# Patient Record
Sex: Female | Born: 1995 | Race: Black or African American | Hispanic: No | Marital: Single | Smoking: Never smoker
Health system: Southern US, Community
[De-identification: ages and names within clinical notes are randomized; demographics above are authoritative.]

## PROBLEM LIST (undated history)

## (undated) DIAGNOSIS — F4322 Adjustment disorder with anxiety: Secondary | ICD-10-CM

## (undated) DIAGNOSIS — IMO0001 Reserved for inherently not codable concepts without codable children: Secondary | ICD-10-CM

## (undated) DIAGNOSIS — R030 Elevated blood-pressure reading, without diagnosis of hypertension: Secondary | ICD-10-CM

## (undated) HISTORY — DX: Adjustment disorder with anxiety: F43.22

## (undated) HISTORY — DX: Reserved for inherently not codable concepts without codable children: IMO0001

## (undated) HISTORY — DX: Elevated blood-pressure reading, without diagnosis of hypertension: R03.0

## (undated) HISTORY — PX: MULTIPLE TOOTH EXTRACTIONS: SHX2053

## (undated) HISTORY — PX: WISDOM TOOTH EXTRACTION: SHX21

---
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
## 2017-04-28 NOTE — Progress Notes (Signed)
BP 131/84 (BP Location: Left Arm, Patient Position: Sitting, Cuff Size: Normal)   Pulse 70   Temp 98.5 F (36.9 C)   Wt 192 lb 6 oz (87.3 kg)   SpO2 100%   BMI 31.43 kg/m    Subjective:    Patient ID: Crystal Sullivan, female    DOB: [birth year], 22 y.o.   MRN: [id number]  HPI: Crystal Sullivan is a 22 y.o. female  Chief Complaint  Patient presents with  . screening for STD   VAGINAL DISCHARGE Duration: Past couple of weeks Discharge description: cottage cheese  Pruritus: yes Dysuria: no Malodorous: no Urinary frequency: no Fevers: no Abdominal pain: no  Sexual activity: practicing safe sex History of sexually transmitted diseases: no Recent antibiotic use: no Context: recurrent BV  Treatments attempted: diflucan  STD SCREENING Sexual activity:  Recent unprotected sexual encounter Contraception: yes Recent unprotected intercourse: yes History of sexually transmitted diseases: no Previous sexually transmitted disease screening: no Genital lesions: no Vaginal discharge: yes Dysuria: no Swollen lymph nodes: no Fevers: no Rash: no  Relevant past medical, surgical, family and social history reviewed and updated as indicated. Interim medical history since our last visit reviewed. Allergies and medications reviewed and updated.  Review of Systems  Constitutional: Negative.   Respiratory: Negative.   Cardiovascular: Negative.   Genitourinary: Positive for vaginal discharge. Negative for decreased urine volume, difficulty urinating, dyspareunia, dysuria, enuresis, flank pain, frequency, genital sores, hematuria, menstrual problem, pelvic pain, urgency, vaginal bleeding and vaginal pain.  Hematological: Negative.   Psychiatric/Behavioral: Negative.     Per HPI unless specifically indicated above     Objective:    BP 131/84 (BP Location: Left Arm, Patient Position: Sitting, Cuff Size: Normal)   Pulse 70   Temp 98.5 F (36.9 C)   Wt 192 lb 6 oz (87.3 kg)   SpO2  100%   BMI 31.43 kg/m   Wt Readings from Last 3 Encounters:  04/29/17 192 lb 6 oz (87.3 kg) (97 %, Z= 1.82)*  12/15/16 192 lb (87.1 kg) (97 %, Z= 1.82)*  10/06/16 196 lb 1 oz (88.9 kg) (97 %, Z= 1.89)*   * Growth percentiles are based on CDC (Girls, 2-20 Years) data.    Physical Exam  Constitutional: She is oriented to person, place, and time. She appears well-developed and well-nourished. No distress.  HENT:  Head: Normocephalic and atraumatic.  Right Ear: Hearing normal.  Left Ear: Hearing normal.  Nose: Nose normal.  Eyes: Conjunctivae and lids are normal. Right eye exhibits no discharge. Left eye exhibits no discharge. No scleral icterus.  Pulmonary/Chest: Effort normal. No respiratory distress.  Abdominal: Hernia confirmed negative in the right inguinal area and confirmed negative in the left inguinal area.  Genitourinary: No labial fusion. There is no rash, tenderness, lesion or injury on the right labia. There is no rash, tenderness, lesion or injury on the left labia. No erythema, tenderness or bleeding in the vagina. No foreign body in the vagina. No signs of injury around the vagina. Vaginal discharge found.  Musculoskeletal: Normal range of motion.  Neurological: She is alert and oriented to person, place, and time.  Skin: Skin is warm, dry and intact. No rash noted. She is not diaphoretic. No erythema. No pallor.  Psychiatric: She has a normal mood and affect. Her speech is normal and behavior is normal. Judgment and thought content normal. Cognition and memory are normal.  Nursing note and vitals reviewed.   Results for orders placed or performed in visit  on 12/15/16  GC/Chlamydia Probe Amp  Result Value Ref Range   Chlamydia trachomatis, NAA Negative Negative   Neisseria gonorrhoeae by PCR Negative Negative  RPR  Result Value Ref Range   RPR Ser Ql Non Reactive Non Reactive  HSV(herpes simplex vrs) 1+2 ab-IgG  Result Value Ref Range   HSV 1 Glycoprotein G Ab, IgG  <0.91 0.00 - 0.90 index   HSV 2 IgG, Type Spec <0.91 0.00 - 0.90 index  HIV antibody  Result Value Ref Range   HIV Screen 4th Generation wRfx Non Reactive Non Reactive  WET PREP FOR TRICH, YEAST, CLUE  Result Value Ref Range   Trichomonas Exam Negative Negative   Yeast Exam Negative Negative   Clue Cell Exam Positive (A) Negative      Assessment & Plan:   Problem List Items Addressed This Visit    None    Visit Diagnoses    Routine screening for STI (sexually transmitted infection)    -  Primary   No significant concerns would like to be screened. Labs drawn today. Await results.    Relevant Orders   GC/Chlamydia Probe Amp   HIV antibody   WET PREP FOR TRICH, YEAST, CLUE   HSV(herpes simplex vrs) 1+2 ab-IgG   Hepatitis, Acute   RPR   Vaginal discharge       Negative wet prep. Call with any concerns.    Relevant Orders   WET PREP FOR TRICH, YEAST, CLUE       Follow up plan: Return if symptoms worsen or fail to improve.

## 2017-04-29 ENCOUNTER — Ambulatory Visit (INDEPENDENT_AMBULATORY_CARE_PROVIDER_SITE_OTHER): Payer: BLUE CROSS/BLUE SHIELD | Admitting: Family Medicine

## 2017-04-29 ENCOUNTER — Encounter: Payer: Self-pay | Admitting: Family Medicine

## 2017-04-29 VITALS — BP 131/84 | HR 70 | Temp 98.5°F | Wt 192.4 lb

## 2017-04-29 DIAGNOSIS — Z113 Encounter for screening for infections with a predominantly sexual mode of transmission: Secondary | ICD-10-CM | POA: Diagnosis not present

## 2017-04-29 DIAGNOSIS — N898 Other specified noninflammatory disorders of vagina: Secondary | ICD-10-CM

## 2017-04-29 LAB — WET PREP FOR TRICH, YEAST, CLUE
CLUE CELL EXAM: NEGATIVE
TRICHOMONAS EXAM: NEGATIVE
Yeast Exam: NEGATIVE

## 2017-04-30 ENCOUNTER — Telehealth: Payer: Self-pay | Admitting: Family Medicine

## 2017-04-30 LAB — HEPATITIS PANEL, ACUTE
HEP B C IGM: NEGATIVE
Hep A IgM: NEGATIVE
Hep C Virus Ab: <0.1 s/co ratio (ref 0.0–0.9)
Hepatitis B Surface Ag: NEGATIVE

## 2017-04-30 LAB — HSV(HERPES SIMPLEX VRS) I + II AB-IGG

## 2017-04-30 LAB — RPR: RPR Ser Ql: NONREACTIVE

## 2017-04-30 LAB — HIV ANTIBODY (ROUTINE TESTING W REFLEX): HIV Screen 4th Generation wRfx: NONREACTIVE

## 2017-04-30 NOTE — Telephone Encounter (Signed)
Please let her know that all her labs came back negative, still waiting on the gonorrhea and chalmydia

## 2017-04-30 NOTE — Telephone Encounter (Signed)
Patient notified of lab results

## 2017-05-04 ENCOUNTER — Telehealth: Payer: Self-pay | Admitting: Family Medicine

## 2017-05-04 NOTE — Telephone Encounter (Signed)
Patient notified

## 2017-05-04 NOTE — Telephone Encounter (Signed)
Please let Crystal Sullivan know that her GC/chalmydia was negative

## 2017-05-06 LAB — GC/CHLAMYDIA PROBE AMP
CHLAMYDIA, DNA PROBE: NEGATIVE
Neisseria gonorrhoeae by PCR: NEGATIVE

## 2017-07-07 ENCOUNTER — Encounter: Payer: Self-pay | Admitting: Family Medicine

## 2017-07-07 ENCOUNTER — Ambulatory Visit: Payer: BLUE CROSS/BLUE SHIELD | Admitting: Family Medicine

## 2017-07-07 VITALS — BP 124/86 | HR 87 | Temp 98.4°F | Wt 200.2 lb

## 2017-07-07 DIAGNOSIS — Z113 Encounter for screening for infections with a predominantly sexual mode of transmission: Secondary | ICD-10-CM | POA: Diagnosis not present

## 2017-07-07 NOTE — Progress Notes (Signed)
BP 124/86 (BP Location: Left Arm, Patient Position: Sitting, Cuff Size: Large)   Pulse 87   Temp 98.4 F (36.9 C) (Oral)   Wt 200 lb 3 oz (90.8 kg)   SpO2 100%   BMI 32.71 kg/m    Subjective:    Patient ID: Crystal Sullivan, female    DOB: 1996/03/05, 22 y.o.   MRN: [id number]  HPI: Crystal Sullivan is a 22 y.o. female  Chief Complaint  Patient presents with  . STI screening   STD SCREENING Sexual activity:  Practices careful partner selection, always uses condoms Contraception: no Recent unprotected intercourse: no History of sexually transmitted diseases: no Previous sexually transmitted disease screening: yes Genital lesions: no Vaginal discharge: no Dysuria: no Swollen lymph nodes: no Fevers: no Rash: no  Occasionally get tingling around her mouth, worried about a rash showing up but never has one.   Relevant past medical, surgical, family and social history reviewed and updated as indicated. Interim medical history since our last visit reviewed. Allergies and medications reviewed and updated.  Review of Systems  Constitutional: Negative.   Respiratory: Negative.   Cardiovascular: Negative.   Psychiatric/Behavioral: Negative.     Per HPI unless specifically indicated above     Objective:    BP 124/86 (BP Location: Left Arm, Patient Position: Sitting, Cuff Size: Large)   Pulse 87   Temp 98.4 F (36.9 C) (Oral)   Wt 200 lb 3 oz (90.8 kg)   SpO2 100%   BMI 32.71 kg/m   Wt Readings from Last 3 Encounters:  07/07/17 200 lb 3 oz (90.8 kg)  04/29/17 192 lb 6 oz (87.3 kg) (97 %, Z= 1.82)*  12/15/16 192 lb (87.1 kg) (97 %, Z= 1.82)*   * Growth percentiles are based on CDC (Girls, 2-20 Years) data.    Physical Exam  Constitutional: She is oriented to person, place, and time. She appears well-developed and well-nourished. No distress.  HENT:  Head: Normocephalic and atraumatic.  Right Ear: Hearing normal.  Left Ear: Hearing normal.  Nose: Nose normal.    Eyes: Conjunctivae and lids are normal. Right eye exhibits no discharge. Left eye exhibits no discharge. No scleral icterus.  Cardiovascular: Normal rate, regular rhythm, normal heart sounds and intact distal pulses. Exam reveals no gallop and no friction rub.  No murmur heard. Pulmonary/Chest: Effort normal and breath sounds normal. No stridor. No respiratory distress. She has no wheezes. She has no rales. She exhibits no tenderness.  Musculoskeletal: Normal range of motion.  Neurological: She is alert and oriented to person, place, and time.  Skin: Skin is warm, dry and intact. Capillary refill takes less than 2 seconds. No rash noted. She is not diaphoretic. No erythema. No pallor.  Psychiatric: She has a normal mood and affect. Her speech is normal and behavior is normal. Judgment and thought content normal. Cognition and memory are normal.    Results for orders placed or performed in visit on 04/29/17  GC/Chlamydia Probe Amp  Result Value Ref Range   Chlamydia trachomatis, NAA Negative Negative   Neisseria gonorrhoeae by PCR Negative Negative  WET PREP FOR TRICH, YEAST, CLUE  Result Value Ref Range   Trichomonas Exam Negative Negative   Yeast Exam Negative Negative   Clue Cell Exam Negative Negative  HIV antibody  Result Value Ref Range   HIV Screen 4th Generation wRfx Non Reactive Non Reactive  HSV(herpes simplex vrs) 1+2 ab-IgG  Result Value Ref Range   HSV 1 Glycoprotein G Ab,  IgG <0.91 0.00 - 0.90 index   HSV 2 IgG, Type Spec <0.91 0.00 - 0.90 index  Hepatitis, Acute  Result Value Ref Range   Hep A IgM Negative Negative   Hepatitis B Surface Ag Negative Negative   Hep B C IgM Negative Negative   Hep C Virus Ab <0.1 0.0 - 0.9 s/co ratio  RPR  Result Value Ref Range   RPR Ser Ql Non Reactive Non Reactive      Assessment & Plan:   Problem List Items Addressed This Visit    None    Visit Diagnoses    Screening for STD (sexually transmitted disease)    -  Primary    Labs drawn today. Await results. Call with any concerns.    Relevant Orders   Hepatitis panel, acute   HSV(herpes simplex vrs) 1+2 ab-IgG   HIV antibody   RPR   GC/Chlamydia Probe Amp       Follow up plan: Return in about 3 months (around 10/06/2017) for Physical.

## 2017-07-08 ENCOUNTER — Telehealth: Payer: Self-pay | Admitting: Family Medicine

## 2017-07-08 LAB — RPR: RPR: NONREACTIVE

## 2017-07-08 LAB — HIV ANTIBODY (ROUTINE TESTING W REFLEX): HIV SCREEN 4TH GENERATION: NONREACTIVE

## 2017-07-08 LAB — HEPATITIS PANEL, ACUTE
HEP A IGM: NEGATIVE
Hep B C IgM: NEGATIVE
Hep C Virus Ab: <0.1 s/co ratio (ref 0.0–0.9)
Hepatitis B Surface Ag: NEGATIVE

## 2017-07-08 LAB — HSV(HERPES SIMPLEX VRS) I + II AB-IGG: HSV 2 IgG, Type Spec: <0.91 index (ref 0.00–0.90)

## 2017-07-08 NOTE — Telephone Encounter (Signed)
Please let Crystal Sullivan know that all her labs are normal- just waiting on the GC/Chlamydia

## 2017-07-08 NOTE — Telephone Encounter (Signed)
Patient notified

## 2017-07-09 ENCOUNTER — Telehealth: Payer: Self-pay | Admitting: Family Medicine

## 2017-07-09 LAB — GC/CHLAMYDIA PROBE AMP
CHLAMYDIA, DNA PROBE: NEGATIVE
Neisseria gonorrhoeae by PCR: NEGATIVE

## 2017-07-09 NOTE — Telephone Encounter (Signed)
Patient notified

## 2017-07-09 NOTE — Telephone Encounter (Signed)
Please let her know that her GC/Chlamydia came back negative.

## 2017-07-31 ENCOUNTER — Ambulatory Visit (INDEPENDENT_AMBULATORY_CARE_PROVIDER_SITE_OTHER): Payer: BLUE CROSS/BLUE SHIELD | Admitting: Family Medicine

## 2017-07-31 ENCOUNTER — Encounter: Payer: Self-pay | Admitting: Family Medicine

## 2017-07-31 VITALS — BP 144/83 | HR 68 | Temp 98.5°F | Wt 203.0 lb

## 2017-07-31 DIAGNOSIS — Z113 Encounter for screening for infections with a predominantly sexual mode of transmission: Secondary | ICD-10-CM

## 2017-07-31 DIAGNOSIS — N926 Irregular menstruation, unspecified: Secondary | ICD-10-CM

## 2017-07-31 LAB — UA/M W/RFLX CULTURE, ROUTINE
Bilirubin, UA: NEGATIVE
GLUCOSE, UA: NEGATIVE
Ketones, UA: NEGATIVE
Leukocytes, UA: NEGATIVE
NITRITE UA: NEGATIVE
Protein, UA: NEGATIVE
RBC, UA: NEGATIVE
Specific Gravity, UA: 1.025 (ref 1.005–1.030)
UUROB: 0.2 mg/dL (ref 0.2–1.0)
pH, UA: 6 (ref 5.0–7.5)

## 2017-07-31 LAB — PREGNANCY, URINE: Preg Test, Ur: NEGATIVE

## 2017-07-31 LAB — CHLAMYDIA SCREEN: CHLAMYDIA PROBE AMP: NORMAL

## 2017-07-31 NOTE — Patient Instructions (Signed)
Follow up as needed

## 2017-07-31 NOTE — Progress Notes (Signed)
BP (!) 144/83   Pulse 68   Temp 98.5 F (36.9 C) (Oral)   Wt 203 lb (92.1 kg)   LMP 06/29/2017 (Approximate)   SpO2 100%   BMI 33.17 kg/m    Subjective:    Patient ID: Crystal Sullivan, female    DOB: 10-02-1995, 22 y.o.   MRN: [id number]  HPI: Crystal Sullivan is a 22 y.o. female  Chief Complaint  Patient presents with  . Labs Only    pt would like STD screening   Pt presents today for STI screening. New partner, no new sxs. Denies dysuria, abnormal discharge, vaginal irritation or lesions. Also 2 days late for her period and wanting a pregnancy test done.   Relevant past medical, surgical, family and social history reviewed and updated as indicated. Interim medical history since our last visit reviewed. Allergies and medications reviewed and updated.  Review of Systems  Per HPI unless specifically indicated above     Objective:    BP (!) 144/83   Pulse 68   Temp 98.5 F (36.9 C) (Oral)   Wt 203 lb (92.1 kg)   LMP 06/29/2017 (Approximate)   SpO2 100%   BMI 33.17 kg/m   Wt Readings from Last 3 Encounters:  07/31/17 203 lb (92.1 kg)  07/07/17 200 lb 3 oz (90.8 kg)  04/29/17 192 lb 6 oz (87.3 kg) (97 %, Z= 1.82)*   * Growth percentiles are based on CDC (Girls, 2-20 Years) data.    Physical Exam  Constitutional: She is oriented to person, place, and time. She appears well-developed and well-nourished. No distress.  HENT:  Head: Atraumatic.  Eyes: Pupils are equal, round, and reactive to light. Conjunctivae are normal.  Neck: Normal range of motion. Neck supple.  Cardiovascular: Normal rate.  Pulmonary/Chest: Effort normal. No respiratory distress.  Abdominal: Soft. Bowel sounds are normal. There is no tenderness.  Musculoskeletal: Normal range of motion.  Neurological: She is alert and oriented to person, place, and time.  Skin: Skin is warm and dry. No rash noted.  Psychiatric: She has a normal mood and affect. Her behavior is normal.  Nursing note and  vitals reviewed.   Results for orders placed or performed in visit on 07/07/17  GC/Chlamydia Probe Amp  Result Value Ref Range   Chlamydia trachomatis, NAA Negative Negative   Neisseria gonorrhoeae by PCR Negative Negative  Hepatitis panel, acute  Result Value Ref Range   Hep A IgM Negative Negative   Hepatitis B Surface Ag Negative Negative   Hep B C IgM Negative Negative   Hep C Virus Ab <0.1 0.0 - 0.9 s/co ratio  HSV(herpes simplex vrs) 1+2 ab-IgG  Result Value Ref Range   HSV 1 Glycoprotein G Ab, IgG <0.91 0.00 - 0.90 index   HSV 2 IgG, Type Spec <0.91 0.00 - 0.90 index  HIV antibody  Result Value Ref Range   HIV Screen 4th Generation wRfx Non Reactive Non Reactive  RPR  Result Value Ref Range   RPR Ser Ql Non Reactive Non Reactive      Assessment & Plan:   Problem List Items Addressed This Visit    None    Visit Diagnoses    Missed period    -  Primary   Urine pregnancy negative, recommended safe sexual practices unless desiring conception at this time. Pt states she would be ok either way.   Relevant Orders   Pregnancy, urine   Routine screening for STI (sexually transmitted infection)  Await screening labs. Asymptomatic. Recommended safe sexual practices with use of condoms and regular screening as well as sxs to watch for   Relevant Orders   HIV antibody   RPR   HSV(herpes simplex vrs) 1+2 ab-IgG   GC/Chlamydia Probe Amp   UA/M w/rflx Culture, Routine       Follow up plan: Return if symptoms worsen or fail to improve.

## 2017-08-01 LAB — HSV(HERPES SIMPLEX VRS) I + II AB-IGG

## 2017-08-01 LAB — HIV ANTIBODY (ROUTINE TESTING W REFLEX): HIV SCREEN 4TH GENERATION: NONREACTIVE

## 2017-08-01 LAB — RPR: RPR Ser Ql: NONREACTIVE

## 2017-08-03 ENCOUNTER — Telehealth: Payer: Self-pay | Admitting: Family Medicine

## 2017-08-03 LAB — GC/CHLAMYDIA PROBE AMP
Chlamydia trachomatis, NAA: NEGATIVE
Neisseria gonorrhoeae by PCR: NEGATIVE

## 2017-08-03 NOTE — Telephone Encounter (Signed)
Patient notified

## 2017-08-03 NOTE — Telephone Encounter (Signed)
Please let her know that all her labs came back normal. Thanks! 

## 2017-08-10 ENCOUNTER — Ambulatory Visit (INDEPENDENT_AMBULATORY_CARE_PROVIDER_SITE_OTHER): Payer: BLUE CROSS/BLUE SHIELD | Admitting: Unknown Physician Specialty

## 2017-08-10 ENCOUNTER — Encounter: Payer: Self-pay | Admitting: Unknown Physician Specialty

## 2017-08-10 VITALS — BP 136/88 | HR 78 | Ht 65.6 in | Wt 202.4 lb

## 2017-08-10 DIAGNOSIS — E669 Obesity, unspecified: Secondary | ICD-10-CM | POA: Diagnosis not present

## 2017-08-10 DIAGNOSIS — Z113 Encounter for screening for infections with a predominantly sexual mode of transmission: Secondary | ICD-10-CM

## 2017-08-10 LAB — BAYER DCA HB A1C WAIVED: HB A1C (BAYER DCA - WAIVED): 5.7 % (ref <7.0)

## 2017-08-10 NOTE — Progress Notes (Addendum)
BP 136/88   Pulse 78   Ht 5' 5.6" (1.666 m)   Wt 202 lb 6.4 oz (91.8 kg)   LMP 08/06/2017 (Exact Date)   SpO2 97%   BMI 33.07 kg/m    Subjective:    Patient ID: Crystal Sullivan, female    DOB: 10-Jul-1995, 22 y.o.   MRN: [id number]  HPI: Crystal Sullivan is a 22 y.o. female  Chief Complaint  Patient presents with  . Labs Only    pt states she would like STD labs done   STD screening Pt presents today with the desire for STI screening.  No symptoms but does not trust former partner.  He is out of her life at this time.  Menses started today.    Desires no birth control at this time as caused weight gain.  Does have irregular menses ever since  Family History  Problem Relation Age of Onset  . Hypertension Mother   . Hypertension Father   . Cancer Father        prostate  . Diabetes Paternal Grandmother   . Stroke Paternal Grandfather   . Hypertension Maternal Grandmother      Relevant past medical, surgical, family and social history reviewed and updated as indicated. Interim medical history since our last visit reviewed. Allergies and medications reviewed and updated.  Review of Systems  All other systems reviewed and are negative.   Per HPI unless specifically indicated above     Objective:    BP 136/88   Pulse 78   Ht 5' 5.6" (1.666 m)   Wt 202 lb 6.4 oz (91.8 kg)   LMP 08/06/2017 (Exact Date)   SpO2 97%   BMI 33.07 kg/m   Wt Readings from Last 3 Encounters:  08/10/17 202 lb 6.4 oz (91.8 kg)  07/31/17 203 lb (92.1 kg)  07/07/17 200 lb 3 oz (90.8 kg)    Physical Exam  Constitutional: She is oriented to person, place, and time. She appears well-developed and well-nourished. No distress.  HENT:  Head: Normocephalic and atraumatic.  Eyes: Conjunctivae and lids are normal. Right eye exhibits no discharge. Left eye exhibits no discharge. No scleral icterus.  Cardiovascular: Normal rate.  Pulmonary/Chest: Effort normal.  Abdominal: Normal appearance. There  is no splenomegaly or hepatomegaly.  Musculoskeletal: Normal range of motion.  Neurological: She is alert and oriented to person, place, and time.  Skin: Skin is intact. No rash noted. No pallor.  Psychiatric: She has a normal mood and affect. Her behavior is normal. Judgment and thought content normal.       Assessment & Plan:   Problem List Items Addressed This Visit      Unprioritized   Obesity (BMI 30-39.9)    New diagnosis.  Hgb A1C is 5.7% Qualifies for diet and exercise program which is free of charge at [facility].  Number given.  ? PCOS.  Will explore through primary at physical in July      Relevant Orders   Bayer DCA Hb A1c Waived    Other Visit Diagnoses    Screening examination for STD (sexually transmitted disease)    -  Primary   Counseled pt on pregnancy and STI prevention.  Desires no birth control at this time   Relevant Orders   HIV antibody   HSV(herpes simplex vrs) 1+2 ab-IgG   RPR   GC/Chlamydia Probe Amp([facility])       Follow up plan: Return in July for physical

## 2017-08-10 NOTE — Assessment & Plan Note (Addendum)
New diagnosis.  Hgb A1C is 5.7% Qualifies for diet and exercise program which is free of charge at [facility].  Number given.  ? PCOS.  Will explore through primary at physical in July

## 2017-08-10 NOTE — Addendum Note (Signed)
Addended by: Gabriel Cirri on: 08/10/2017 09:10 AM   Modules accepted: Level of Service

## 2017-08-10 NOTE — Patient Instructions (Addendum)
Polycystic Ovarian Syndrome Polycystic ovarian syndrome (PCOS) is a common hormonal disorder among women of reproductive age. In most women with PCOS, many small fluid-filled sacs (cysts) grow on the ovaries, and the cysts are not part of a normal menstrual cycle. PCOS can cause problems with your menstrual periods and make it difficult to get pregnant. It can also cause an increased risk of miscarriage with pregnancy. If it is not treated, PCOS can lead to serious health problems, such as diabetes and heart disease. What are the causes? The cause of PCOS is not known, but it may be the result of a combination of certain factors, such as:  Irregular menstrual cycle.  High levels of certain hormones (androgens).  Problems with the hormone that helps to control blood sugar (insulin resistance).  Certain genes.  What increases the risk? This condition is more likely to develop in women who have a family history of PCOS. What are the signs or symptoms? Symptoms of PCOS may include:  Multiple ovarian cysts.  Infrequent periods or no periods.  Periods that are too frequent or too heavy.  Unpredictable periods.  Inability to get pregnant (infertility) because of not ovulating.  Increased growth of hair on the face, chest, stomach, back, thumbs, thighs, or toes.  Acne or oily skin. Acne may develop during adulthood, and it may not respond to treatment.  Pelvic pain.  Weight gain or obesity.  Patches of thickened and dark brown or black skin on the neck, arms, breasts, or thighs (acanthosis nigricans).  Excess hair growth on the face, chest, abdomen, or upper thighs (hirsutism).  How is this diagnosed? This condition is diagnosed based on:  Your medical history.  A physical exam, including a pelvic exam. Your health care provider may look for areas of increased hair growth on your skin.  Tests, such as: ? Ultrasound. This may be used to examine the ovaries and the lining of the  uterus (endometrium) for cysts. ? Blood tests. These may be used to check levels of sugar (glucose), female hormone (testosterone), and female hormones (estrogen and progesterone) in your blood.  How is this treated? There is no cure for PCOS, but treatment can help to manage symptoms and prevent more health problems from developing. Treatment varies depending on:  Your symptoms.  Whether you want to have a baby or whether you need birth control (contraception).  Treatment may include nutrition and lifestyle changes along with:  Progesterone hormone to start a menstrual period.  Birth control pills to help you have regular menstrual periods.  Medicines to make you ovulate, if you want to get pregnant.  Medicine to reduce excessive hair growth.  Surgery, in severe cases. This may involve making small holes in one or both of your ovaries. This decreases the amount of testosterone that your body produces.  Follow these instructions at home:  Take over-the-counter and prescription medicines only as told by your health care provider.  Follow a healthy meal plan. This can help you reduce the effects of PCOS. ? Eat a healthy diet that includes lean proteins, complex carbohydrates, fresh fruits and vegetables, low-fat dairy products, and healthy fats. Make sure to eat enough fiber.  If you are overweight, lose weight as told by your health care provider. ? Losing 10% of your body weight may improve symptoms. ? Your health care provider can determine how much weight loss is best for you and can help you lose weight safely.  Keep all follow-up visits as told by   your health care provider. This is important. Contact a health care provider if:  Your symptoms do not get better with medicine.  You develop new symptoms. This information is not intended to replace advice given to you by your health care provider. Make sure you discuss any questions you have with your health care  provider.\ --------------------------------------------------------- [facility] [phone]

## 2017-08-11 LAB — HIV ANTIBODY (ROUTINE TESTING W REFLEX): HIV Screen 4th Generation wRfx: NONREACTIVE

## 2017-08-11 LAB — HSV(HERPES SIMPLEX VRS) I + II AB-IGG
HSV 1 Glycoprotein G Ab, IgG: <0.91 index (ref 0.00–0.90)
HSV 2 IgG, Type Spec: <0.91 index (ref 0.00–0.90)

## 2017-08-11 LAB — RPR: RPR Ser Ql: NONREACTIVE

## 2017-08-11 LAB — GC/CHLAMYDIA PROBE AMP
Chlamydia trachomatis, NAA: NEGATIVE
NEISSERIA GONORRHOEAE BY PCR: NEGATIVE

## 2017-10-08 ENCOUNTER — Encounter: Payer: Self-pay | Admitting: Family Medicine

## 2017-10-08 ENCOUNTER — Ambulatory Visit (INDEPENDENT_AMBULATORY_CARE_PROVIDER_SITE_OTHER): Payer: BLUE CROSS/BLUE SHIELD | Admitting: Family Medicine

## 2017-10-08 VITALS — BP 124/86 | HR 77 | Temp 97.9°F | Ht 65.5 in | Wt 205.2 lb

## 2017-10-08 DIAGNOSIS — Z113 Encounter for screening for infections with a predominantly sexual mode of transmission: Secondary | ICD-10-CM

## 2017-10-08 DIAGNOSIS — Z0000 Encounter for general adult medical examination without abnormal findings: Secondary | ICD-10-CM

## 2017-10-08 LAB — UA/M W/RFLX CULTURE, ROUTINE
BILIRUBIN UA: NEGATIVE
GLUCOSE, UA: NEGATIVE
KETONES UA: NEGATIVE
Leukocytes, UA: NEGATIVE
NITRITE UA: NEGATIVE
Protein, UA: NEGATIVE
RBC, UA: NEGATIVE
Specific Gravity, UA: <1.005 — ABNORMAL LOW (ref 1.005–1.030)
UUROB: 0.2 mg/dL (ref 0.2–1.0)
pH, UA: 6 (ref 5.0–7.5)

## 2017-10-08 NOTE — Progress Notes (Signed)
BP 124/86 (BP Location: Left Arm, Patient Position: Sitting, Cuff Size: Large)   Pulse 77   Temp 97.9 F (36.6 C)   Ht 5' 5.5" (1.664 m)   Wt 205 lb 3 oz (93.1 kg)   SpO2 99%   BMI 33.63 kg/m    Subjective:    Patient ID: Crystal Sullivan, female    DOB: Sep 02, 1995, 22 y.o.   MRN: [id number]  HPI: Crystal HawksMorgan Knobloch is a 22 y.o. female presenting on 10/08/2017 for comprehensive medical examination. Current medical complaints include:none  Menopausal Symptoms: no  Depression Screen done today and results listed below:  [facility] 10/08/2017 10/06/2016 11/28/2015 09/12/2015  Decreased Interest 0 0 0 1  Down, Depressed, Hopeless 0 0 0 1  PHQ - 2 Score 0 0 0 2  Altered sleeping 0 - 1 1  Tired, decreased energy 0 - 1 1  Change in appetite 0 - 0 0  Feeling bad or failure about yourself  0 - 1 1  Trouble concentrating 0 - 0 0  Moving slowly or fidgety/restless 0 - 0 0  Suicidal thoughts 0 - 0 0  PHQ-9 Score 0 - 3 5  Difficult doing work/chores Not difficult at all - - Somewhat difficult    Past Medical History:  Past Medical History:  Diagnosis Date  . Adjustment disorder with anxiety   . Elevated blood pressure     Surgical History:  Past Surgical History:  Procedure Laterality Date  . MULTIPLE TOOTH EXTRACTIONS     canine teeth removed  . WISDOM TOOTH EXTRACTION      Medications:  Current Outpatient Medications on File Prior to Visit  Medication Sig  . Multiple Vitamin (MULTIVITAMIN) tablet Take 1 tablet by mouth daily.   No current facility-administered medications on file prior to visit.     Allergies:  No Known Allergies  Social History:  Social History   Socioeconomic History  . Marital status: Single    Spouse name: Not on file  . Number of children: Not on file  . Years of education: Not on file  . Highest education level: Not on file  Occupational History  . Not on file  Social Needs  . Financial resource strain: Not on file  . Food  insecurity:    Worry: Not on file    Inability: Not on file  . Transportation needs:    Medical: Not on file    Non-medical: Not on file  Tobacco Use  . Smoking status: Never Smoker  . Smokeless tobacco: Never Used  Substance and Sexual Activity  . Alcohol use: No  . Drug use: No  . Sexual activity: Yes    Birth control/protection: None  Lifestyle  . Physical activity:    Days per week: Not on file    Minutes per session: Not on file  . Stress: Not on file  Relationships  . Social connections:    Talks on phone: Not on file    Gets together: Not on file    Attends religious service: Not on file    Active member of club or organization: Not on file    Attends meetings of clubs or organizations: Not on file    Relationship status: Not on file  . Intimate partner violence:    Fear of current or ex partner: Not on file    Emotionally abused: Not on file    Physically abused: Not on file    Forced sexual activity: Not on  file  Other Topics Concern  . Not on file  Social History Narrative  . Not on file   Social History   Tobacco Use  Smoking Status Never Smoker  Smokeless Tobacco Never Used   Social History   Substance and Sexual Activity  Alcohol Use No    Family History:  Family History  Problem Relation Age of Onset  . Hypertension Mother   . Hypertension Father   . Cancer Father        prostate  . Diabetes Paternal Grandmother   . Stroke Paternal Grandfather   . Hypertension Maternal Grandmother     Past medical history, surgical history, medications, allergies, family history and social history reviewed with patient today and changes made to appropriate areas of the chart.   Review of Systems  Constitutional: Negative.   HENT: Negative.   Eyes: Negative.   Respiratory: Negative.   Cardiovascular: Negative.   Gastrointestinal: Negative.   Genitourinary: Negative.   Musculoskeletal: Negative.   Skin: Negative.   Neurological: Negative.     Endo/Heme/Allergies: Negative.   Psychiatric/Behavioral: Negative.     All other ROS negative except what is listed above and in the HPI.      Objective:    BP 124/86 (BP Location: Left Arm, Patient Position: Sitting, Cuff Size: Large)   Pulse 77   Temp 97.9 F (36.6 C)   Ht 5' 5.5" (1.664 m)   Wt 205 lb 3 oz (93.1 kg)   SpO2 99%   BMI 33.63 kg/m   Wt Readings from Last 3 Encounters:  10/08/17 205 lb 3 oz (93.1 kg)  08/10/17 202 lb 6.4 oz (91.8 kg)  07/31/17 203 lb (92.1 kg)    Physical Exam  Constitutional: She is oriented to person, place, and time. She appears well-developed and well-nourished. No distress.  HENT:  Head: Normocephalic and atraumatic.  Right Ear: Hearing, tympanic membrane, external ear and ear canal normal.  Left Ear: Hearing, tympanic membrane, external ear and ear canal normal.  Nose: Nose normal.  Mouth/Throat: Uvula is midline, oropharynx is clear and moist and mucous membranes are normal. No oropharyngeal exudate.  Eyes: Pupils are equal, round, and reactive to light. Conjunctivae, EOM and lids are normal. Right eye exhibits no discharge. Left eye exhibits no discharge. No scleral icterus.  Neck: Normal range of motion. Neck supple. No JVD present. No tracheal deviation present. No thyromegaly present.  Cardiovascular: Normal rate, regular rhythm, normal heart sounds and intact distal pulses. Exam reveals no gallop and no friction rub.  No murmur heard. Pulmonary/Chest: Effort normal and breath sounds normal. No stridor. No respiratory distress. She has no wheezes. She has no rales. She exhibits no tenderness. Right breast exhibits no inverted nipple, no mass, no nipple discharge, no skin change and no tenderness. Left breast exhibits no inverted nipple, no mass, no nipple discharge, no skin change and no tenderness. No breast swelling, tenderness, discharge or bleeding. Breasts are symmetrical.  Abdominal: Soft. Bowel sounds are normal. She exhibits no  distension and no mass. There is no tenderness. There is no rebound and no guarding. No hernia.  Genitourinary:  Genitourinary Comments: Pelvic exam deferred with shared decision making  Musculoskeletal: Normal range of motion. She exhibits no edema, tenderness or deformity.  Lymphadenopathy:    She has no cervical adenopathy.  Neurological: She is alert and oriented to person, place, and time. She displays normal reflexes. No cranial nerve deficit or sensory deficit. She exhibits normal muscle tone. Coordination normal.  Skin: Skin is warm, dry and intact. Capillary refill takes less than 2 seconds. No rash noted. She is not diaphoretic. No erythema. No pallor.  Psychiatric: She has a normal mood and affect. Her speech is normal and behavior is normal. Judgment and thought content normal. Cognition and memory are normal.  Nursing note and vitals reviewed.   Results for orders placed or performed in visit on 08/10/17  GC/Chlamydia Probe Amp([facility])  Result Value Ref Range   Chlamydia trachomatis, NAA Negative Negative   Neisseria gonorrhoeae by PCR Negative Negative  HIV antibody  Result Value Ref Range   HIV Screen 4th Generation wRfx Non Reactive Non Reactive  HSV(herpes simplex vrs) 1+2 ab-IgG  Result Value Ref Range   HSV 1 Glycoprotein G Ab, IgG <0.91 0.00 - 0.90 index   HSV 2 IgG, Type Spec <0.91 0.00 - 0.90 index  RPR  Result Value Ref Range   RPR Ser Ql Non Reactive Non Reactive  Bayer DCA Hb A1c Waived  Result Value Ref Range   HB A1C (BAYER DCA - WAIVED) 5.7 <7.0 %      Assessment & Plan:   Problem List Items Addressed This Visit    None    Visit Diagnoses    Routine general medical examination at a health care facility    -  Primary   Vaccines up to date. Screening labs checked today. Continue diet and exercise. Call with any concerns.    Relevant Orders   CBC with Differential/Platelet   Comprehensive metabolic panel   Lipid Panel w/o Chol/HDL Ratio   TSH    UA/M w/rflx Culture, Routine   Screening for STD (sexually transmitted disease)       Labs drawn today. Await results. Call with any concerns.    Relevant Orders   Hepatitis C antibody   HIV antibody   RPR   Hepatitis, Acute   GC/Chlamydia Probe Amp   HSV(herpes simplex vrs) 1+2 ab-IgG       Follow up plan: Return in about 1 year (around 10/09/2018) for Physical.   LABORATORY TESTING:  - Pap smear: not applicable  IMMUNIZATIONS:   - Tdap: Tetanus vaccination status reviewed: last tetanus booster within 10 years. - Influenza: Postponed to flu season - Pneumovax: Up to date  PATIENT COUNSELING:   Advised to take 1 mg of folate supplement per day if capable of pregnancy.   Sexuality: Discussed sexually transmitted diseases, partner selection, use of condoms, avoidance of unintended pregnancy  and contraceptive alternatives.   Advised to avoid cigarette smoking.  I discussed with the patient that most people either abstain from alcohol or drink within safe limits (<=14/week and <=4 drinks/occasion for males, <=7/weeks and <= 3 drinks/occasion for females) and that the risk for alcohol disorders and other health effects rises proportionally with the number of drinks per week and how often a drinker exceeds daily limits.  Discussed cessation/primary prevention of drug use and availability of treatment for abuse.   Diet: Encouraged to adjust caloric intake to maintain  or achieve ideal body weight, to reduce intake of dietary saturated fat and total fat, to limit sodium intake by avoiding high sodium foods and not adding table salt, and to maintain adequate dietary potassium and calcium preferably from fresh fruits, vegetables, and low-fat dairy products.    stressed the importance of regular exercise  Injury prevention: Discussed safety belts, safety helmets, smoke detector, smoking near bedding or upholstery.   Dental health: Discussed importance of regular tooth brushing,  flossing, and dental visits.    NEXT PREVENTATIVE PHYSICAL DUE IN 1 YEAR. Return in about 1 year (around 10/09/2018) for Physical.

## 2017-10-09 ENCOUNTER — Encounter: Payer: Self-pay | Admitting: Family Medicine

## 2017-10-09 ENCOUNTER — Telehealth: Payer: Self-pay | Admitting: Family Medicine

## 2017-10-09 LAB — CBC WITH DIFFERENTIAL/PLATELET
BASOS: 0 %
Basophils Absolute: 0 10*3/uL (ref 0.0–0.2)
EOS (ABSOLUTE): 0.1 10*3/uL (ref 0.0–0.4)
EOS: 2 %
Hematocrit: 41.9 % (ref 34.0–46.6)
Hemoglobin: 13.7 g/dL (ref 11.1–15.9)
IMMATURE GRANS (ABS): 0 10*3/uL (ref 0.0–0.1)
IMMATURE GRANULOCYTES: 0 %
LYMPHS: 40 %
Lymphocytes Absolute: 2.5 10*3/uL (ref 0.7–3.1)
MCH: 28 pg (ref 26.6–33.0)
MCHC: 32.7 g/dL (ref 31.5–35.7)
MCV: 86 fL (ref 79–97)
Monocytes Absolute: 0.5 10*3/uL (ref 0.1–0.9)
Monocytes: 7 %
NEUTROS PCT: 51 %
Neutrophils Absolute: 3.3 10*3/uL (ref 1.4–7.0)
PLATELETS: 271 10*3/uL (ref 150–450)
RBC: 4.89 x10E6/uL (ref 3.77–5.28)
RDW: 14.3 % (ref 12.3–15.4)
WBC: 6.4 10*3/uL (ref 3.4–10.8)

## 2017-10-09 LAB — COMPREHENSIVE METABOLIC PANEL
A/G RATIO: 1.9 (ref 1.2–2.2)
ALBUMIN: 4.5 g/dL (ref 3.5–5.5)
ALT: 21 IU/L (ref 0–32)
AST: 19 IU/L (ref 0–40)
Alkaline Phosphatase: 65 IU/L (ref 39–117)
BILIRUBIN TOTAL: 0.4 mg/dL (ref 0.0–1.2)
BUN / CREAT RATIO: 15 (ref 9–23)
BUN: 13 mg/dL (ref 6–20)
CHLORIDE: 100 mmol/L (ref 96–106)
CO2: 21 mmol/L (ref 20–29)
Calcium: 9.4 mg/dL (ref 8.7–10.2)
Creatinine, Ser: 0.87 mg/dL (ref 0.57–1.00)
GFR calc non Af Amer: 96 mL/min/{1.73_m2} (ref >59)
GFR, EST AFRICAN AMERICAN: 111 mL/min/{1.73_m2} (ref >59)
Globulin, Total: 2.4 g/dL (ref 1.5–4.5)
Glucose: 90 mg/dL (ref 65–99)
POTASSIUM: 4.1 mmol/L (ref 3.5–5.2)
SODIUM: 139 mmol/L (ref 134–144)
TOTAL PROTEIN: 6.9 g/dL (ref 6.0–8.5)

## 2017-10-09 LAB — HEPATITIS PANEL, ACUTE
HEP A IGM: NEGATIVE
HEP B C IGM: NEGATIVE
HEP B S AG: NEGATIVE
Hep C Virus Ab: <0.1 s/co ratio (ref 0.0–0.9)

## 2017-10-09 LAB — HSV(HERPES SIMPLEX VRS) I + II AB-IGG
HSV 1 Glycoprotein G Ab, IgG: <0.91 index (ref 0.00–0.90)
HSV 2 IgG, Type Spec: <0.91 index (ref 0.00–0.90)

## 2017-10-09 LAB — RPR: RPR: NONREACTIVE

## 2017-10-09 LAB — LIPID PANEL W/O CHOL/HDL RATIO
Cholesterol, Total: 218 mg/dL — ABNORMAL HIGH (ref 100–199)
HDL: 58 mg/dL (ref >39)
LDL Calculated: 148 mg/dL — ABNORMAL HIGH (ref 0–99)
Triglycerides: 59 mg/dL (ref 0–149)
VLDL Cholesterol Cal: 12 mg/dL (ref 5–40)

## 2017-10-09 LAB — TSH: TSH: 1.31 u[IU]/mL (ref 0.450–4.500)

## 2017-10-09 LAB — HIV ANTIBODY (ROUTINE TESTING W REFLEX): HIV Screen 4th Generation wRfx: NONREACTIVE

## 2017-10-09 NOTE — Telephone Encounter (Signed)
Tried calling patient. Someone answered and hung up. Will try again later.

## 2017-10-09 NOTE — Telephone Encounter (Signed)
Please let her know that her labs came back negative. We are just waiting on the GC/Chlamydia.

## 2017-10-10 LAB — GC/CHLAMYDIA PROBE AMP
Chlamydia trachomatis, NAA: NEGATIVE
Neisseria gonorrhoeae by PCR: NEGATIVE

## 2017-10-11 NOTE — Telephone Encounter (Signed)
Please let her know that her GC/Chlamydia came back normal. Thanks!

## 2017-10-12 NOTE — Telephone Encounter (Signed)
Patient notified and stated that when I called on Friday she was unavailable to answer.  Patient notified of result and verbalized understanding.

## 2018-10-12 ENCOUNTER — Encounter: Payer: Self-pay | Admitting: Family Medicine

## 2018-10-12 ENCOUNTER — Other Ambulatory Visit (HOSPITAL_COMMUNITY)
Admission: RE | Admit: 2018-10-12 | Discharge: 2018-10-12 | Disposition: A | Discharge status: Home / Self Care | Payer: Self-pay | Source: Ambulatory Visit | Attending: Family Medicine | Admitting: Family Medicine

## 2018-10-12 ENCOUNTER — Ambulatory Visit (INDEPENDENT_AMBULATORY_CARE_PROVIDER_SITE_OTHER): Payer: BC Managed Care – PPO | Admitting: Family Medicine

## 2018-10-12 ENCOUNTER — Other Ambulatory Visit: Payer: Self-pay

## 2018-10-12 VITALS — BP 123/81 | HR 80 | Temp 98.5°F | Ht 64.5 in | Wt 214.0 lb

## 2018-10-12 DIAGNOSIS — Z23 Encounter for immunization: Secondary | ICD-10-CM

## 2018-10-12 DIAGNOSIS — Z124 Encounter for screening for malignant neoplasm of cervix: Secondary | ICD-10-CM

## 2018-10-12 DIAGNOSIS — Z113 Encounter for screening for infections with a predominantly sexual mode of transmission: Secondary | ICD-10-CM

## 2018-10-12 DIAGNOSIS — Z0000 Encounter for general adult medical examination without abnormal findings: Secondary | ICD-10-CM

## 2018-10-12 LAB — UA/M W/RFLX CULTURE, ROUTINE
Bilirubin, UA: NEGATIVE
Glucose, UA: NEGATIVE
Leukocytes,UA: NEGATIVE
Nitrite, UA: NEGATIVE
Protein,UA: NEGATIVE
RBC, UA: NEGATIVE
Specific Gravity, UA: 1.025 (ref 1.005–1.030)
Urobilinogen, Ur: 0.2 mg/dL (ref 0.2–1.0)
pH, UA: 5.5 (ref 5.0–7.5)

## 2018-10-12 NOTE — Patient Instructions (Signed)
Health Maintenance, Female Adopting a healthy lifestyle and getting preventive care are important in promoting health and wellness. Ask your health care provider about:  The right schedule for you to have regular tests and exams.  Things you can do on your own to prevent diseases and keep yourself healthy. What should I know about diet, weight, and exercise? Eat a healthy diet   Eat a diet that includes plenty of vegetables, fruits, low-fat dairy products, and lean protein.  Do not eat a lot of foods that are high in solid fats, added sugars, or sodium. Maintain a healthy weight Body mass index (BMI) is used to identify weight problems. It estimates body fat based on height and weight. Your health care provider can help determine your BMI and help you achieve or maintain a healthy weight. Get regular exercise Get regular exercise. This is one of the most important things you can do for your health. Most adults should:  Exercise for at least 150 minutes each week. The exercise should increase your heart rate and make you sweat (moderate-intensity exercise).  Do strengthening exercises at least twice a week. This is in addition to the moderate-intensity exercise.  Spend less time sitting. Even light physical activity can be beneficial. Watch cholesterol and blood lipids Have your blood tested for lipids and cholesterol at 20 years of age, then have this test every 5 years. Have your cholesterol levels checked more often if:  Your lipid or cholesterol levels are high.  You are older than 21 years of age.  You are at high risk for heart disease. What should I know about cancer screening? Depending on your health history and family history, you may need to have cancer screening at various ages. This may include screening for:  Breast cancer.  Cervical cancer.  Colorectal cancer.  Skin cancer.  Lung cancer. What should I know about heart disease, diabetes, and high blood  pressure? Blood pressure and heart disease  High blood pressure causes heart disease and increases the risk of stroke. This is more likely to develop in people who have high blood pressure readings, are of African descent, or are overweight.  Have your blood pressure checked: ? Every 3-5 years if you are 18-39 years of age. ? Every year if you are 40 years old or older. Diabetes Have regular diabetes screenings. This checks your fasting blood sugar level. Have the screening done:  Once every three years after age 40 if you are at a normal weight and have a low risk for diabetes.  More often and at a younger age if you are overweight or have a high risk for diabetes. What should I know about preventing infection? Hepatitis B If you have a higher risk for hepatitis B, you should be screened for this virus. Talk with your health care provider to find out if you are at risk for hepatitis B infection. Hepatitis C Testing is recommended for:  Everyone born from 1945 through 1965.  Anyone with known risk factors for hepatitis C. Sexually transmitted infections (STIs)  Get screened for STIs, including gonorrhea and chlamydia, if: ? You are sexually active and are younger than 21 years of age. ? You are older than 21 years of age and your health care provider tells you that you are at risk for this type of infection. ? Your sexual activity has changed since you were last screened, and you are at increased risk for chlamydia or gonorrhea. Ask your health care provider if   you are at risk.  Ask your health care provider about whether you are at high risk for HIV. Your health care provider may recommend a prescription medicine to help prevent HIV infection. If you choose to take medicine to prevent HIV, you should first get tested for HIV. You should then be tested every 3 months for as long as you are taking the medicine. Pregnancy  If you are about to stop having your period (premenopausal) and  you may become pregnant, seek counseling before you get pregnant.  Take 400 to 800 micrograms (mcg) of folic acid every day if you become pregnant.  Ask for birth control (contraception) if you want to prevent pregnancy. Osteoporosis and menopause Osteoporosis is a disease in which the bones lose minerals and strength with aging. This can result in bone fractures. If you are 65 years old or older, or if you are at risk for osteoporosis and fractures, ask your health care provider if you should:  Be screened for bone loss.  Take a calcium or vitamin D supplement to lower your risk of fractures.  Be given hormone replacement therapy (HRT) to treat symptoms of menopause. Follow these instructions at home: Lifestyle  Do not use any products that contain nicotine or tobacco, such as cigarettes, e-cigarettes, and chewing tobacco. If you need help quitting, ask your health care provider.  Do not use street drugs.  Do not share needles.  Ask your health care provider for help if you need support or information about quitting drugs. Alcohol use  Do not drink alcohol if: ? Your health care provider tells you not to drink. ? You are pregnant, may be pregnant, or are planning to become pregnant.  If you drink alcohol: ? Limit how much you use to 0-1 drink a day. ? Limit intake if you are breastfeeding.  Be aware of how much alcohol is in your drink. In the [place] one drink equals one 12 oz bottle of beer (355 mL), one 5 oz glass of wine (148 mL), or one 1 oz glass of hard liquor (44 mL). General instructions  Schedule regular health, dental, and eye exams.  Stay current with your vaccines.  Tell your health care provider if: ? You often feel depressed. ? You have ever been abused or do not feel safe at home. Summary  Adopting a healthy lifestyle and getting preventive care are important in promoting health and wellness.  Follow your health care provider's instructions about healthy  diet, exercising, and getting tested or screened for diseases.  Follow your health care provider's instructions on monitoring your cholesterol and blood pressure. This information is not intended to replace advice given to you by your health care provider. Make sure you discuss any questions you have with your health care provider. Document Released: 09/30/2010 Document Revised: 03/10/2018 Document Reviewed: 03/10/2018 Elsevier Patient Education  2020 [employer] https://www.cdc.gov/vaccines/hcp/vis/vis-statements/tdap.pdf">  Tdap Vaccine (Tetanus, Diphtheria and Pertussis): What You Need to Know 1. Why get vaccinated? Tetanus, diphtheria and pertussis are very serious diseases. Tdap vaccine can protect us from these diseases. And, Tdap vaccine given to pregnant women can protect newborn babies against pertussis.. TETANUS (Lockjaw) is rare in the [place] today. It causes painful muscle tightening and stiffness, usually all over the body.  It can lead to tightening of muscles in the head and neck so you can't open your mouth, swallow, or sometimes even breathe. Tetanus kills about 1 out of 10 people who are infected even after receiving the   best medical care. DIPHTHERIA is also rare in the [place] today. It can cause a thick coating to form in the back of the throat.  It can lead to breathing problems, heart failure, paralysis, and death. PERTUSSIS (Whooping Cough) causes severe coughing spells, which can cause difficulty breathing, vomiting and disturbed sleep.  It can also lead to weight loss, incontinence, and rib fractures. Up to 2 in 100 adolescents and 5 in 100 adults with pertussis are hospitalized or have complications, which could include pneumonia or death. These diseases are caused by bacteria. Diphtheria and pertussis are spread from person to person through secretions from coughing or sneezing. Tetanus enters the body through cuts, scratches, or wounds. Before vaccines,  as many as 200,000 cases of diphtheria, 200,000 cases of pertussis, and hundreds of cases of tetanus, were reported in the [place] each year. Since vaccination began, reports of cases for tetanus and diphtheria have dropped by about 99% and for pertussis by about 80%. 2. Tdap vaccine Tdap vaccine can protect adolescents and adults from tetanus, diphtheria, and pertussis. One dose of Tdap is routinely given at age 11 or 12. People who did not get Tdap at that age should get it as soon as possible. Tdap is especially important for healthcare professionals and anyone having close contact with a baby younger than 12 months. Pregnant women should get a dose of Tdap during every pregnancy, to protect the newborn from pertussis. Infants are most at risk for severe, life-threatening complications from pertussis. Another vaccine, called Td, protects against tetanus and diphtheria, but not pertussis. A Td booster should be given every 10 years. Tdap may be given as one of these boosters if you have never gotten Tdap before. Tdap may also be given after a severe cut or burn to prevent tetanus infection. Your doctor or the person giving you the vaccine can give you more information. Tdap may safely be given at the same time as other vaccines. 3. Some people should not get this vaccine  A person who has ever had a life-threatening allergic reaction after a previous dose of any diphtheria, tetanus or pertussis containing vaccine, OR has a severe allergy to any part of this vaccine, should not get Tdap vaccine. Tell the person giving the vaccine about any severe allergies.  Anyone who had coma or long repeated seizures within 7 days after a childhood dose of DTP or DTaP, or a previous dose of Tdap, should not get Tdap, unless a cause other than the vaccine was found. They can still get Td.  Talk to your doctor if you: ? have seizures or another nervous system problem, ? had severe pain or swelling after any  vaccine containing diphtheria, tetanus or pertussis, ? ever had a condition called Guillain-Barr Syndrome (GBS), ? aren't feeling well on the day the shot is scheduled. 4. Risks With any medicine, including vaccines, there is a chance of side effects. These are usually mild and go away on their own. Serious reactions are also possible but are rare. Most people who get Tdap vaccine do not have any problems with it. Mild problems following Tdap (Did not interfere with activities)  Pain where the shot was given (about 3 in 4 adolescents or 2 in 3 adults)  Redness or swelling where the shot was given (about 1 person in 5)  Mild fever of at least 100.4F (up to about 1 in 25 adolescents or 1 in 100 adults)  Headache (about 3 or 4 people in   10)  Tiredness (about 1 person in 3 or 4)  Nausea, vomiting, diarrhea, stomach ache (up to 1 in 4 adolescents or 1 in 10 adults)  Chills, sore joints (about 1 person in 10)  Body aches (about 1 person in 3 or 4)  Rash, swollen glands (uncommon) Moderate problems following Tdap (Interfered with activities, but did not require medical attention)  Pain where the shot was given (up to 1 in 5 or 6)  Redness or swelling where the shot was given (up to about 1 in 16 adolescents or 1 in 12 adults)  Fever over 102F (about 1 in 100 adolescents or 1 in 250 adults)  Headache (about 1 in 7 adolescents or 1 in 10 adults)  Nausea, vomiting, diarrhea, stomach ache (up to 1 or 3 people in 100)  Swelling of the entire arm where the shot was given (up to about 1 in 500). Severe problems following Tdap (Unable to perform usual activities; required medical attention)  Swelling, severe pain, bleeding and redness in the arm where the shot was given (rare). Problems that could happen after any vaccine:  People sometimes faint after a medical procedure, including vaccination. Sitting or lying down for about 15 minutes can help prevent fainting, and injuries  caused by a fall. Tell your doctor if you feel dizzy, or have vision changes or ringing in the ears.  Some people get severe pain in the shoulder and have difficulty moving the arm where a shot was given. This happens very rarely.  Any medication can cause a severe allergic reaction. Such reactions from a vaccine are very rare, estimated at fewer than 1 in a million doses, and would happen within a few minutes to a few hours after the vaccination. As with any medicine, there is a very remote chance of a vaccine causing a serious injury or death. The safety of vaccines is always being monitored. For more information, visit: www.cdc.gov/vaccinesafety/ 5. What if there is a serious problem? What should I look for?  Look for anything that concerns you, such as signs of a severe allergic reaction, very high fever, or unusual behavior. Signs of a severe allergic reaction can include hives, swelling of the face and throat, difficulty breathing, a fast heartbeat, dizziness, and weakness. These would usually start a few minutes to a few hours after the vaccination. What should I do?  If you think it is a severe allergic reaction or other emergency that can't wait, call 9-1-1 or get the person to the nearest hospital. Otherwise, call your doctor.  Afterward, the reaction should be reported to the Vaccine Adverse Event Reporting System (VAERS). Your doctor might file this report, or you can do it yourself through the VAERS web site at www.vaers.hhs.gov, or by calling 1-800-822-7967. VAERS does not give medical advice. 6. The National Vaccine Injury Compensation Program The National Vaccine Injury Compensation Program (VICP) is a federal program that was created to compensate people who may have been injured by certain vaccines. Persons who believe they may have been injured by a vaccine can learn about the program and about filing a claim by calling 1-800-338-2382 or visiting the VICP website at  www.hrsa.gov/vaccinecompensation. There is a time limit to file a claim for compensation. 7. How can I learn more?  Ask your doctor. He or she can give you the vaccine package insert or suggest other sources of information.  Call your local or state health department.  Contact the Centers for Disease Control and Prevention (CDC): ?   Call 1-800-232-4636 (1-800-CDC-INFO) or ? Visit CDC's website at www.cdc.gov/vaccines Vaccine Information Statement Tdap Vaccine (05/24/2013) This information is not intended to replace advice given to you by your health care provider. Make sure you discuss any questions you have with your health care provider. Document Released: 09/16/2011 Document Revised: 11/02/2017 Document Reviewed: 11/02/2017 Elsevier Interactive Patient Education  2020 [employer]  

## 2018-10-12 NOTE — Progress Notes (Signed)
BP 123/81   Pulse 80   Temp 98.5 F (36.9 C) (Oral)   Ht 5' 4.5" (1.638 m)   Wt 214 lb (97.1 kg)   SpO2 99%   BMI 36.17 kg/m    Subjective:    Patient ID: Crystal Sullivan, female    DOB: Sep 23, 1995, 23 y.o.   MRN: [id number]  HPI: Crystal Sullivan is a 23 y.o. female presenting on 10/12/2018 for comprehensive medical examination. Current medical complaints include:none  Menopausal Symptoms: no  Depression Screen done today and results listed below:  Depression screen [facility] 2/9 10/12/2018 10/08/2017 10/06/2016 11/28/2015 09/12/2015  Decreased Interest 0 0 0 0 1  Down, Depressed, Hopeless 0 0 0 0 1  PHQ - 2 Score 0 0 0 0 2  Altered sleeping 1 0 - 1 1  Tired, decreased energy 0 0 - 1 1  Change in appetite 1 0 - 0 0  Feeling bad or failure about yourself  0 0 - 1 1  Trouble concentrating 0 0 - 0 0  Moving slowly or fidgety/restless 0 0 - 0 0  Suicidal thoughts 0 0 - 0 0  PHQ-9 Score 2 0 - 3 5  Difficult doing work/chores Not difficult at all Not difficult at all - - Somewhat difficult    Past Medical History:  Past Medical History:  Diagnosis Date  . Adjustment disorder with anxiety   . Elevated blood pressure     Surgical History:  Past Surgical History:  Procedure Laterality Date  . MULTIPLE TOOTH EXTRACTIONS     canine teeth removed  . WISDOM TOOTH EXTRACTION      Medications:  Current Outpatient Medications on File Prior to Visit  Medication Sig  . Multiple Vitamin (MULTIVITAMIN) tablet Take 1 tablet by mouth daily.   No current facility-administered medications on file prior to visit.     Allergies:  No Known Allergies  Social History:  Social History   Socioeconomic History  . Marital status: Single    Spouse name: Not on file  . Number of children: Not on file  . Years of education: Not on file  . Highest education level: Not on file  Occupational History  . Not on file  Social Needs  . Financial resource strain: Not on file  . Food insecurity   Worry: Not on file    Inability: Not on file  . Transportation needs    Medical: Not on file    Non-medical: Not on file  Tobacco Use  . Smoking status: Never Smoker  . Smokeless tobacco: Never Used  Substance and Sexual Activity  . Alcohol use: No  . Drug use: No  . Sexual activity: Yes    Birth control/protection: None  Lifestyle  . Physical activity    Days per week: Not on file    Minutes per session: Not on file  . Stress: Not on file  Relationships  . Social Musicianconnections    Talks on phone: Not on file    Gets together: Not on file    Attends religious service: Not on file    Active member of club or organization: Not on file    Attends meetings of clubs or organizations: Not on file    Relationship status: Not on file  . Intimate partner violence    Fear of current or ex partner: Not on file    Emotionally abused: Not on file    Physically abused: Not on file    Forced sexual  activity: Not on file  Other Topics Concern  . Not on file  Social History Narrative  . Not on file   Social History   Tobacco Use  Smoking Status Never Smoker  Smokeless Tobacco Never Used   Social History   Substance and Sexual Activity  Alcohol Use No    Family History:  Family History  Problem Relation Age of Onset  . Hypertension Mother   . Hypertension Father   . Cancer Father        prostate  . Diabetes Paternal Grandmother   . Stroke Paternal Grandfather   . Hypertension Maternal Grandmother     Past medical history, surgical history, medications, allergies, family history and social history reviewed with patient today and changes made to appropriate areas of the chart.   Review of Systems  Constitutional: Negative.   HENT: Negative.   Eyes: Negative.   Respiratory: Negative.   Cardiovascular: Negative.   Gastrointestinal: Negative.   Genitourinary: Negative.   Musculoskeletal: Negative.   Skin: Negative.   Neurological: Negative.   Endo/Heme/Allergies:  Negative.   Psychiatric/Behavioral: Negative.     All other ROS negative except what is listed above and in the HPI.      Objective:    BP 123/81   Pulse 80   Temp 98.5 F (36.9 C) (Oral)   Ht 5' 4.5" (1.638 m)   Wt 214 lb (97.1 kg)   SpO2 99%   BMI 36.17 kg/m   Wt Readings from Last 3 Encounters:  10/12/18 214 lb (97.1 kg)  10/08/17 205 lb 3 oz (93.1 kg)  08/10/17 202 lb 6.4 oz (91.8 kg)    Physical Exam Vitals signs and nursing note reviewed. Exam conducted with a chaperone present.  Constitutional:      General: She is not in acute distress.    Appearance: Normal appearance. She is not ill-appearing, toxic-appearing or diaphoretic.  HENT:     Head: Normocephalic and atraumatic.     Right Ear: Tympanic membrane, ear canal and external ear normal. There is no impacted cerumen.     Left Ear: Tympanic membrane, ear canal and external ear normal. There is no impacted cerumen.     Nose: Nose normal. No congestion or rhinorrhea.     Mouth/Throat:     Mouth: Mucous membranes are moist.     Pharynx: Oropharynx is clear. No oropharyngeal exudate or posterior oropharyngeal erythema.  Eyes:     General: No scleral icterus.       Right eye: No discharge.        Left eye: No discharge.     Extraocular Movements: Extraocular movements intact.     Conjunctiva/sclera: Conjunctivae normal.     Pupils: Pupils are equal, round, and reactive to light.  Neck:     Musculoskeletal: Normal range of motion and neck supple. No neck rigidity or muscular tenderness.     Vascular: No carotid bruit.  Cardiovascular:     Rate and Rhythm: Normal rate and regular rhythm.     Pulses: Normal pulses.     Heart sounds: No murmur. No friction rub. No gallop.   Pulmonary:     Effort: Pulmonary effort is normal. No respiratory distress.     Breath sounds: Normal breath sounds. No stridor. No wheezing, rhonchi or rales.  Chest:     Chest wall: No tenderness.  Abdominal:     General: Abdomen is flat.  Bowel sounds are normal. There is no distension.  Palpations: Abdomen is soft. There is no mass.     Tenderness: There is no abdominal tenderness. There is no right CVA tenderness, left CVA tenderness, guarding or rebound.     Hernia: No hernia is present. There is no hernia in the left inguinal area or right inguinal area.  Genitourinary:    General: Normal vulva.     Labia:        Right: No rash, tenderness, lesion or injury.        Left: No rash, tenderness, lesion or injury.      Urethra: No prolapse, urethral pain, urethral swelling or urethral lesion.     Vagina: Normal.     Cervix: Normal.     Uterus: Normal.      Adnexa: Left adnexa normal.     Comments: Breast exams deferred with shared decision making Musculoskeletal: Normal range of motion.        General: No swelling, tenderness, deformity or signs of injury.     Right lower leg: No edema.     Left lower leg: No edema.  Lymphadenopathy:     Cervical: No cervical adenopathy.     Lower Body: No right inguinal adenopathy. No left inguinal adenopathy.  Skin:    General: Skin is warm and dry.     Capillary Refill: Capillary refill takes less than 2 seconds.     Coloration: Skin is not jaundiced or pale.     Findings: No bruising, erythema, lesion or rash.  Neurological:     General: No focal deficit present.     Mental Status: She is alert and oriented to person, place, and time. Mental status is at baseline.     Cranial Nerves: No cranial nerve deficit.     Sensory: No sensory deficit.     Motor: No weakness.     Coordination: Coordination normal.     Gait: Gait normal.     Deep Tendon Reflexes: Reflexes normal.  Psychiatric:        Mood and Affect: Mood normal.        Behavior: Behavior normal.        Thought Content: Thought content normal.        Judgment: Judgment normal.     Results for orders placed or performed in visit on 10/09/17  Chlamydia screen   Specimen: Genital  Result Value Ref Range    Chlamydia Probe Amp Normal       Assessment & Plan:   Problem List Items Addressed This Visit    None    Visit Diagnoses    Routine general medical examination at a health care facility    -  Primary   Vaccines up to date. Screening labs checked today. Continue diet and exercise. Pap done today. Call with any concerns.    Relevant Orders   CBC with Differential/Platelet   Comprehensive metabolic panel   Lipid Panel w/o Chol/HDL Ratio   TSH   UA/M w/rflx Culture, Routine   Screening for STD (sexually transmitted disease)       Labs drawn today. Await results.    Relevant Orders   HIV Antibody (routine testing w rflx)   GC/Chlamydia Probe Amp   RPR   Hepatitis, Acute   HSV(herpes simplex vrs) 1+2 ab-IgG   Need for diphtheria-tetanus-pertussis (Tdap) vaccine       Tdap given today.    Relevant Orders   Tdap vaccine greater than or equal to 7yo IM (Completed)   Screening for cervical  cancer       Pap done today. Await results.    Relevant Orders   Cytology - PAP       Follow up plan: Return in about 1 year (around 10/12/2019) for Physical.   LABORATORY TESTING:  - Pap smear: pap done  IMMUNIZATIONS:   - Tdap: Tetanus vaccination status reviewed: Tdap vaccination indicated and given today. - Influenza: Postponed to flu season - Pneumovax: Not applicable - HPV: Up to date  PATIENT COUNSELING:   Advised to take 1 mg of folate supplement per day if capable of pregnancy.   Sexuality: Discussed sexually transmitted diseases, partner selection, use of condoms, avoidance of unintended pregnancy  and contraceptive alternatives.   Advised to avoid cigarette smoking.  I discussed with the patient that most people either abstain from alcohol or drink within safe limits (<=14/week and <=4 drinks/occasion for males, <=7/weeks and <= 3 drinks/occasion for females) and that the risk for alcohol disorders and other health effects rises proportionally with the number of drinks per  week and how often a drinker exceeds daily limits.  Discussed cessation/primary prevention of drug use and availability of treatment for abuse.   Diet: Encouraged to adjust caloric intake to maintain  or achieve ideal body weight, to reduce intake of dietary saturated fat and total fat, to limit sodium intake by avoiding high sodium foods and not adding table salt, and to maintain adequate dietary potassium and calcium preferably from fresh fruits, vegetables, and low-fat dairy products.    stressed the importance of regular exercise  Injury prevention: Discussed safety belts, safety helmets, smoke detector, smoking near bedding or upholstery.   Dental health: Discussed importance of regular tooth brushing, flossing, and dental visits.    NEXT PREVENTATIVE PHYSICAL DUE IN 1 YEAR. Return in about 1 year (around 10/12/2019) for Physical.

## 2018-10-13 LAB — CBC WITH DIFFERENTIAL/PLATELET
Basophils Absolute: 0 10*3/uL (ref 0.0–0.2)
Basos: 0 %
EOS (ABSOLUTE): 0.1 10*3/uL (ref 0.0–0.4)
Eos: 1 %
Hematocrit: 43.3 % (ref 34.0–46.6)
Hemoglobin: 13.8 g/dL (ref 11.1–15.9)
Immature Grans (Abs): 0 10*3/uL (ref 0.0–0.1)
Immature Granulocytes: 0 %
Lymphocytes Absolute: 2.4 10*3/uL (ref 0.7–3.1)
Lymphs: 34 %
MCH: 26.3 pg — ABNORMAL LOW (ref 26.6–33.0)
MCHC: 31.9 g/dL (ref 31.5–35.7)
MCV: 83 fL (ref 79–97)
Monocytes Absolute: 0.4 10*3/uL (ref 0.1–0.9)
Monocytes: 6 %
Neutrophils Absolute: 4.2 10*3/uL (ref 1.4–7.0)
Neutrophils: 59 %
Platelets: 326 10*3/uL (ref 150–450)
RBC: 5.24 x10E6/uL (ref 3.77–5.28)
RDW: 13.8 % (ref 11.7–15.4)
WBC: 7.2 10*3/uL (ref 3.4–10.8)

## 2018-10-13 LAB — COMPREHENSIVE METABOLIC PANEL
ALT: 11 IU/L (ref 0–32)
AST: 15 IU/L (ref 0–40)
Albumin/Globulin Ratio: 1.9 (ref 1.2–2.2)
Albumin: 4.5 g/dL (ref 3.9–5.0)
Alkaline Phosphatase: 65 IU/L (ref 39–117)
BUN/Creatinine Ratio: 14 (ref 9–23)
BUN: 13 mg/dL (ref 6–20)
Bilirubin Total: 0.3 mg/dL (ref 0.0–1.2)
CO2: 22 mmol/L (ref 20–29)
Calcium: 9.7 mg/dL (ref 8.7–10.2)
Chloride: 100 mmol/L (ref 96–106)
Creatinine, Ser: 0.93 mg/dL (ref 0.57–1.00)
GFR calc Af Amer: 102 mL/min/{1.73_m2} (ref >59)
GFR calc non Af Amer: 88 mL/min/{1.73_m2} (ref >59)
Globulin, Total: 2.4 g/dL (ref 1.5–4.5)
Glucose: 102 mg/dL — ABNORMAL HIGH (ref 65–99)
Potassium: 4.3 mmol/L (ref 3.5–5.2)
Sodium: 138 mmol/L (ref 134–144)
Total Protein: 6.9 g/dL (ref 6.0–8.5)

## 2018-10-13 LAB — LIPID PANEL W/O CHOL/HDL RATIO
Cholesterol, Total: 223 mg/dL — ABNORMAL HIGH (ref 100–199)
HDL: 51 mg/dL (ref >39)
LDL Calculated: 154 mg/dL — ABNORMAL HIGH (ref 0–99)
Triglycerides: 91 mg/dL (ref 0–149)
VLDL Cholesterol Cal: 18 mg/dL (ref 5–40)

## 2018-10-13 LAB — RPR: RPR Ser Ql: NONREACTIVE

## 2018-10-13 LAB — CYTOLOGY - PAP: Diagnosis: NEGATIVE

## 2018-10-13 LAB — HEPATITIS PANEL, ACUTE
Hep A IgM: NEGATIVE
Hep B C IgM: NEGATIVE
Hep C Virus Ab: 0.1 s/co ratio (ref 0.0–0.9)
Hepatitis B Surface Ag: NEGATIVE

## 2018-10-13 LAB — HSV(HERPES SIMPLEX VRS) I + II AB-IGG
HSV 1 Glycoprotein G Ab, IgG: <0.91 index (ref 0.00–0.90)
HSV 2 IgG, Type Spec: <0.91 index (ref 0.00–0.90)

## 2018-10-13 LAB — TSH: TSH: 2.78 u[IU]/mL (ref 0.450–4.500)

## 2018-10-13 LAB — HIV ANTIBODY (ROUTINE TESTING W REFLEX): HIV Screen 4th Generation wRfx: NONREACTIVE

## 2018-10-15 ENCOUNTER — Encounter: Payer: Self-pay | Admitting: Family Medicine

## 2018-10-17 LAB — GC/CHLAMYDIA PROBE AMP
Chlamydia trachomatis, NAA: NEGATIVE
Neisseria Gonorrhoeae by PCR: NEGATIVE

## 2018-11-26 ENCOUNTER — Telehealth: Payer: Self-pay

## 2018-11-26 NOTE — Telephone Encounter (Signed)
Called and left a message for patient to return my call.  Copied from [place] [id number]. Topic: General - Call Back - No Documentation >> Nov 26, 2018  3:16 PM Erick Blinks wrote: Reason for CRM: Pt called requesting call back to discuss lab results. Advised to activate MyChart  Best contact [phone]

## 2018-11-29 NOTE — Telephone Encounter (Signed)
Patient notified of results.

## 2019-07-31 ENCOUNTER — Encounter: Payer: Self-pay | Admitting: Emergency Medicine

## 2019-07-31 ENCOUNTER — Emergency Department
Admission: EM | Admit: 2019-07-31 | Discharge: 2019-08-01 | Disposition: A | Discharge status: Home / Self Care | Payer: BC Managed Care – PPO | Attending: Emergency Medicine | Admitting: Emergency Medicine

## 2019-07-31 ENCOUNTER — Other Ambulatory Visit: Payer: Self-pay

## 2019-07-31 DIAGNOSIS — M546 Pain in thoracic spine: Secondary | ICD-10-CM | POA: Diagnosis not present

## 2019-07-31 DIAGNOSIS — M542 Cervicalgia: Secondary | ICD-10-CM | POA: Diagnosis not present

## 2019-07-31 DIAGNOSIS — M79621 Pain in right upper arm: Secondary | ICD-10-CM | POA: Diagnosis not present

## 2019-07-31 MED ORDER — KETOROLAC TROMETHAMINE 10 MG PO TABS: 10.0000 mg | ORAL_TABLET | Freq: Four times a day (QID) | ORAL | 0 refills | Status: AC | PRN

## 2019-07-31 MED ORDER — KETOROLAC TROMETHAMINE 30 MG/ML IJ SOLN
30.0000 mg | Freq: Once | INTRAMUSCULAR | Status: AC
  Administered 2019-07-31: 30 mg via INTRAMUSCULAR
  Filled 2019-07-31: qty 1
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
## 2019-08-11 ENCOUNTER — Ambulatory Visit: Payer: BC Managed Care – PPO | Admitting: Family Medicine

## 2019-08-12 ENCOUNTER — Encounter: Payer: Self-pay | Admitting: Family Medicine

## 2019-08-12 ENCOUNTER — Other Ambulatory Visit: Payer: Self-pay

## 2019-08-12 ENCOUNTER — Ambulatory Visit (INDEPENDENT_AMBULATORY_CARE_PROVIDER_SITE_OTHER): Payer: BC Managed Care – PPO | Admitting: Family Medicine

## 2019-08-12 VITALS — BP 126/77 | HR 83 | Temp 98.1°F | Ht 64.76 in | Wt 218.2 lb

## 2019-08-12 DIAGNOSIS — S134XXA Sprain of ligaments of cervical spine, initial encounter: Secondary | ICD-10-CM | POA: Diagnosis not present

## 2019-08-12 MED ORDER — NAPROXEN 500 MG PO TABS: 500.0000 mg | ORAL_TABLET | Freq: Two times a day (BID) | ORAL | 3 refills | Status: DC

## 2019-08-12 MED ORDER — CYCLOBENZAPRINE HCL 10 MG PO TABS
10.0000 mg | ORAL_TABLET | Freq: Three times a day (TID) | ORAL | 1 refills | Status: DC | PRN
Start: 2019-08-12 — End: 2020-04-23
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
## 2019-08-12 NOTE — Patient Instructions (Addendum)
Cervical Strain and Sprain Rehab Ask your health care provider which exercises are safe for you. Do exercises exactly as told by your health care provider and adjust them as directed. It is normal to feel mild stretching, pulling, tightness, or discomfort as you do these exercises. Stop right away if you feel sudden pain or your pain gets worse. Do not begin these exercises until told by your health care provider. Stretching and range-of-motion exercises Cervical side bending  1. Using good posture, sit on a stable chair or stand up. 2. Without moving your shoulders, slowly tilt your left / right ear to your shoulder until you feel a stretch in the opposite side neck muscles. You should be looking straight ahead. 3. Hold for __________ seconds. 4. Repeat with the other side of your neck. Repeat __________ times. Complete this exercise __________ times a day. Cervical rotation  1. Using good posture, sit on a stable chair or stand up. 2. Slowly turn your head to the side as if you are looking over your left / right shoulder. ? Keep your eyes level with the ground. ? Stop when you feel a stretch along the side and the back of your neck. 3. Hold for __________ seconds. 4. Repeat this by turning to your other side. Repeat __________ times. Complete this exercise __________ times a day. Thoracic extension and pectoral stretch 1. Roll a towel or a small blanket so it is about 4 inches (10 cm) in diameter. 2. Lie down on your back on a firm surface. 3. Put the towel lengthwise, under your spine in the middle of your back. It should not be under your shoulder blades. The towel should line up with your spine from your middle back to your lower back. 4. Put your hands behind your head and let your elbows fall out to your sides. 5. Hold for __________ seconds. Repeat __________ times. Complete this exercise __________ times a day. Strengthening exercises Isometric upper cervical flexion 1. Lie on  your back with a thin pillow behind your head and a small rolled-up towel under your neck. 2. Gently tuck your chin toward your chest and nod your head down to look toward your feet. Do not lift your head off the pillow. 3. Hold for __________ seconds. 4. Release the tension slowly. Relax your neck muscles completely before you repeat this exercise. Repeat __________ times. Complete this exercise __________ times a day. Isometric cervical extension  1. Stand about 6 inches (15 cm) away from a wall, with your back facing the wall. 2. Place a soft object, about 6-8 inches (15-20 cm) in diameter, between the back of your head and the wall. A soft object could be a small pillow, a ball, or a folded towel. 3. Gently tilt your head back and press into the soft object. Keep your jaw and forehead relaxed. 4. Hold for __________ seconds. 5. Release the tension slowly. Relax your neck muscles completely before you repeat this exercise. Repeat __________ times. Complete this exercise __________ times a day. Posture and body mechanics Body mechanics refers to the movements and positions of your body while you do your daily activities. Posture is part of body mechanics. Good posture and healthy body mechanics can help to relieve stress in your body's tissues and joints. Good posture means that your spine is in its natural S-curve position (your spine is neutral), your shoulders are pulled back slightly, and your head is not tipped forward. The following are general guidelines for applying improved   posture and body mechanics to your everyday activities. Sitting  1. When sitting, keep your spine neutral and keep your feet flat on the floor. Use a footrest, if necessary, and keep your thighs parallel to the floor. Avoid rounding your shoulders, and avoid tilting your head forward. 2. When working at a desk or a computer, keep your desk at a height where your hands are slightly lower than your elbows. Slide your  chair under your desk so you are close enough to maintain good posture. 3. When working at a computer, place your monitor at a height where you are looking straight ahead and you do not have to tilt your head forward or downward to look at the screen. Standing   When standing, keep your spine neutral and keep your feet about hip-width apart. Keep a slight bend in your knees. Your ears, shoulders, and hips should line up.  When you do a task in which you stand in one place for a long time, place one foot up on a stable object that is 2-4 inches (5-10 cm) high, such as a footstool. This helps keep your spine neutral. Resting When lying down and resting, avoid positions that are most painful for you. Try to support your neck in a neutral position. You can use a contour pillow or a small rolled-up towel. Your pillow should support your neck but not push on it. This information is not intended to replace advice given to you by your health care provider. Make sure you discuss any questions you have with your health care provider. Document Revised: 07/07/2018 Document Reviewed: 12/16/2017 Elsevier Patient Education  2020 [employer].  Thoracic Strain Rehab Ask your health care provider which exercises are safe for you. Do exercises exactly as told by your health care provider and adjust them as directed. It is normal to feel mild stretching, pulling, tightness, or discomfort as you do these exercises. Stop right away if you feel sudden pain or your pain gets worse. Do not begin these exercises until told by your health care provider. Stretching and range-of-motion exercise This exercise warms up your muscles and joints and improves the movement and flexibility of your back and shoulders. This exercise also helps to relieve pain. Chest and spine stretch  5. Lie down on your back on a firm surface. 6. Roll a towel or a small blanket so it is about 4 inches (10 cm) in diameter. 7. Put the towel  lengthwise under the middle of your back so it is under your spine, but not under your shoulder blades. 8. Put your hands behind your head and let your elbows fall to your sides. This will increase your stretch. 9. Take a deep breath (inhale). 10. Hold for __________ seconds. 11. Relax after you breathe out (exhale). Repeat __________ times. Complete this exercise __________ times a day. Strengthening exercises These exercises build strength and endurance in your back and your shoulder blade muscles. Endurance is the ability to use your muscles for a long time, even after they get tired. Alternating arm and leg raises  5. Get on your hands and knees on a firm surface. If you are on a hard floor, you may want to use padding, such as an exercise mat, to cushion your knees. 6. Line up your arms and legs. Your hands should be directly below your shoulders, and your knees should be directly below your hips. 7. Lift your left leg behind you. At the same time, raise your right arm  and straighten it in front of you. ? Do not lift your leg higher than your hip. ? Do not lift your arm higher than your shoulder. ? Keep your abdominal and back muscles tight. ? Keep your hips facing the ground. ? Do not arch your back. ? Keep your balance carefully, and do not hold your breath. 8. Hold for __________ seconds. 9. Slowly return to the starting position and repeat with your right leg and your left arm. Repeat __________ times. Complete this exercise __________ times a day. Straight arm rows This exercise is also called shoulder extension exercise. 6. Stand with your feet shoulder width apart. 7. Secure an exercise band to a stable object in front of you so the band is at or above shoulder height. 8. Hold one end of the exercise band in each hand. 9. Straighten your elbows and lift your hands up to shoulder height. 10. Step back, away from the secured end of the exercise band, until the band  stretches. 11. Squeeze your shoulder blades together and pull your hands down to the sides of your thighs. Stop when your hands are straight down by your sides. This is shoulder extension. Do not let your hands go behind your body. 12. Hold for __________ seconds. 13. Slowly return to the starting position. Repeat __________ times. Complete this exercise __________ times a day. Prone shoulder external rotation 5. Lie on your abdomen on a firm bed so your left / right forearm hangs over the edge of the bed and your upper arm is on the bed, straight out from your body. This is the prone position. ? Your elbow should be bent. ? Your palm should be facing your feet. 6. If instructed, hold a __________ weight in your hand. 7. Squeeze your shoulder blade toward the middle of your back. Do not let your shoulder lift toward your ear. 8. Keep your elbow bent in a 90-degree angle (right angle) while you slowly move your forearm up toward the ceiling. Move your forearm up to the height of the bed, toward your head. This is external rotation. ? Your upper arm should not move. ? At the top of the movement, your palm should face the floor. 9. Hold for __________ seconds. 10. Slowly return to the starting position and relax your muscles. Repeat __________ times. Complete this exercise __________ times a day. Rowing scapular retraction This is an exercise in which the shoulder blades (scapulae) are pulled toward each other (retraction). 6. Sit in a stable chair without armrests, or stand up. 7. Secure an exercise band to a stable object in front of you so the band is at shoulder height. 8. Hold one end of the exercise band in each hand. Your palms should face down. 9. Bring your arms out straight in front of you. 10. Step back, away from the secured end of the exercise band, until the band stretches. 11. Pull the band backward. As you do this, bend your elbows and squeeze your shoulder blades together, but  avoid letting the rest of your body move. Do not shrug your shoulders upward while you do this. 12. Stop when your elbows are at your sides or slightly behind your body. 13. Hold for __________ seconds. 14. Slowly straighten your arms to return to the starting position. Repeat __________ times. Complete this exercise __________ times a day. Posture and body mechanics Good posture and healthy body mechanics can help to relieve stress in your body's tissues and joints. Body mechanics refers  to the movements and positions of your body while you do your daily activities. Posture is part of body mechanics. Good posture means:  Your spine is in its natural S-curve position (neutral).  Your shoulders are pulled back slightly.  Your head is not tipped forward. Follow these guidelines to improve your posture and body mechanics in your everyday activities. Standing   When standing, keep your spine neutral and your feet about hip width apart. Keep a slight bend in your knees. Your ears, shoulders, and hips should line up with each other.  When you do a task in which you lean forward while standing in one place for a long time, place one foot up on a stable object that is 2-4 inches (5-10 cm) high, such as a footstool. This helps keep your spine neutral. Sitting   When sitting, keep your spine neutral and keep your feet flat on the floor. Use a footrest, if necessary, and keep your thighs parallel to the floor. Avoid rounding your shoulders, and avoid tilting your head forward.  When working at a desk or a computer, keep your desk at a height where your hands are slightly lower than your elbows. Slide your chair under your desk so you are close enough to maintain good posture.  When working at a computer, place your monitor at a height where you are looking straight ahead and you do not have to tilt your head forward or downward to look at the screen. Resting When lying down and resting, avoid  positions that are most painful for you.  If you have pain with activities such as sitting, bending, stooping, or squatting (flexion-basedactivities), lie in a position in which your body does not bend very much. For example, avoid curling up on your side with your arms and knees near your chest (fetal position).  If you have pain with activities such as standing for a long time or reaching with your arms (extension-basedactivities), lie with your spine in a neutral position and bend your knees slightly. Try the following positions: ? Lie on your side with a pillow between your knees. ? Lie on your back with a pillow under your knees.  Lifting   When lifting objects, keep your feet at least shoulder width apart and tighten your abdominal muscles.  Bend your knees and hips and keep your spine neutral. It is important to lift using the strength of your legs, not your back. Do not lock your knees straight out.  Always ask for help to lift heavy or awkward objects. This information is not intended to replace advice given to you by your health care provider. Make sure you discuss any questions you have with your health care provider. Document Revised: 07/09/2018 Document Reviewed: 04/26/2018 Elsevier Patient Education  2020 [employer].

## 2019-08-15 ENCOUNTER — Other Ambulatory Visit: Payer: Self-pay

## 2019-08-15 ENCOUNTER — Ambulatory Visit
Admission: RE | Admit: 2019-08-15 | Discharge: 2019-08-15 | Disposition: A | Discharge status: Home / Self Care | Payer: BC Managed Care – PPO | Source: Ambulatory Visit | Attending: Family Medicine | Admitting: Family Medicine

## 2019-08-15 DIAGNOSIS — M546 Pain in thoracic spine: Secondary | ICD-10-CM | POA: Diagnosis not present

## 2019-08-15 DIAGNOSIS — S134XXA Sprain of ligaments of cervical spine, initial encounter: Secondary | ICD-10-CM | POA: Diagnosis not present

## 2019-08-15 DIAGNOSIS — S199XXA Unspecified injury of neck, initial encounter: Secondary | ICD-10-CM | POA: Diagnosis not present

## 2019-08-15 DIAGNOSIS — S299XXA Unspecified injury of thorax, initial encounter: Secondary | ICD-10-CM | POA: Diagnosis not present

## 2019-08-26 ENCOUNTER — Encounter: Payer: Self-pay | Admitting: Family Medicine

## 2019-08-26 ENCOUNTER — Ambulatory Visit (INDEPENDENT_AMBULATORY_CARE_PROVIDER_SITE_OTHER): Payer: BC Managed Care – PPO | Admitting: Family Medicine

## 2019-08-26 ENCOUNTER — Other Ambulatory Visit: Payer: Self-pay

## 2019-08-26 VITALS — BP 130/78 | HR 77 | Temp 98.5°F | Wt 219.2 lb

## 2019-08-26 DIAGNOSIS — S134XXD Sprain of ligaments of cervical spine, subsequent encounter: Secondary | ICD-10-CM
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
## 2019-10-17 ENCOUNTER — Encounter: Payer: Self-pay | Admitting: Family Medicine

## 2019-10-17 ENCOUNTER — Other Ambulatory Visit: Payer: Self-pay

## 2019-10-17 ENCOUNTER — Ambulatory Visit (INDEPENDENT_AMBULATORY_CARE_PROVIDER_SITE_OTHER): Payer: BC Managed Care – PPO | Admitting: Family Medicine

## 2019-10-17 VITALS — BP 121/82 | HR 68 | Temp 98.5°F | Ht 65.0 in | Wt 223.2 lb

## 2019-10-17 DIAGNOSIS — Z0000 Encounter for general adult medical examination without abnormal findings: Secondary | ICD-10-CM | POA: Diagnosis not present

## 2019-10-17 LAB — URINALYSIS, ROUTINE W REFLEX MICROSCOPIC
Bilirubin, UA: NEGATIVE
Glucose, UA: NEGATIVE
Ketones, UA: NEGATIVE
Leukocytes,UA: NEGATIVE
Nitrite, UA: NEGATIVE
Protein,UA: NEGATIVE
Specific Gravity, UA: 1.025 (ref 1.005–1.030)
Urobilinogen, Ur: 0.2 mg/dL (ref 0.2–1.0)
pH, UA: 5.5 (ref 5.0–7.5)

## 2019-10-17 LAB — MICROSCOPIC EXAMINATION: WBC, UA: NONE SEEN /hpf (ref 0–5)

## 2019-10-17 NOTE — Patient Instructions (Signed)
Health Maintenance, Female Adopting a healthy lifestyle and getting preventive care are important in promoting health and wellness. Ask your health care provider about:  The right schedule for you to have regular tests and exams.  Things you can do on your own to prevent diseases and keep yourself healthy. What should I know about diet, weight, and exercise? Eat a healthy diet   Eat a diet that includes plenty of vegetables, fruits, low-fat dairy products, and lean protein.  Do not eat a lot of foods that are high in solid fats, added sugars, or sodium. Maintain a healthy weight Body mass index (BMI) is used to identify weight problems. It estimates body fat based on height and weight. Your health care provider can help determine your BMI and help you achieve or maintain a healthy weight. Get regular exercise Get regular exercise. This is one of the most important things you can do for your health. Most adults should:  Exercise for at least 150 minutes each week. The exercise should increase your heart rate and make you sweat (moderate-intensity exercise).  Do strengthening exercises at least twice a week. This is in addition to the moderate-intensity exercise.  Spend less time sitting. Even light physical activity can be beneficial. Watch cholesterol and blood lipids Have your blood tested for lipids and cholesterol at 22 years of age, then have this test every 5 years. Have your cholesterol levels checked more often if:  Your lipid or cholesterol levels are high.  You are older than 22 years of age.  You are at high risk for heart disease. What should I know about cancer screening? Depending on your health history and family history, you may need to have cancer screening at various ages. This may include screening for:  Breast cancer.  Cervical cancer.  Colorectal cancer.  Skin cancer.  Lung cancer. What should I know about heart disease, diabetes, and high blood  pressure? Blood pressure and heart disease  High blood pressure causes heart disease and increases the risk of stroke. This is more likely to develop in people who have high blood pressure readings, are of African descent, or are overweight.  Have your blood pressure checked: ? Every 3-5 years if you are 18-39 years of age. ? Every year if you are 40 years old or older. Diabetes Have regular diabetes screenings. This checks your fasting blood sugar level. Have the screening done:  Once every three years after age 40 if you are at a normal weight and have a low risk for diabetes.  More often and at a younger age if you are overweight or have a high risk for diabetes. What should I know about preventing infection? Hepatitis B If you have a higher risk for hepatitis B, you should be screened for this virus. Talk with your health care provider to find out if you are at risk for hepatitis B infection. Hepatitis C Testing is recommended for:  Everyone born from 1945 through 1965.  Anyone with known risk factors for hepatitis C. Sexually transmitted infections (STIs)  Get screened for STIs, including gonorrhea and chlamydia, if: ? You are sexually active and are younger than 22 years of age. ? You are older than 22 years of age and your health care provider tells you that you are at risk for this type of infection. ? Your sexual activity has changed since you were last screened, and you are at increased risk for chlamydia or gonorrhea. Ask your health care provider if   you are at risk.  Ask your health care provider about whether you are at high risk for HIV. Your health care provider may recommend a prescription medicine to help prevent HIV infection. If you choose to take medicine to prevent HIV, you should first get tested for HIV. You should then be tested every 3 months for as long as you are taking the medicine. Pregnancy  If you are about to stop having your period (premenopausal) and  you may become pregnant, seek counseling before you get pregnant.  Take 400 to 800 micrograms (mcg) of folic acid every day if you become pregnant.  Ask for birth control (contraception) if you want to prevent pregnancy. Osteoporosis and menopause Osteoporosis is a disease in which the bones lose minerals and strength with aging. This can result in bone fractures. If you are 65 years old or older, or if you are at risk for osteoporosis and fractures, ask your health care provider if you should:  Be screened for bone loss.  Take a calcium or vitamin D supplement to lower your risk of fractures.  Be given hormone replacement therapy (HRT) to treat symptoms of menopause. Follow these instructions at home: Lifestyle  Do not use any products that contain nicotine or tobacco, such as cigarettes, e-cigarettes, and chewing tobacco. If you need help quitting, ask your health care provider.  Do not use street drugs.  Do not share needles.  Ask your health care provider for help if you need support or information about quitting drugs. Alcohol use  Do not drink alcohol if: ? Your health care provider tells you not to drink. ? You are pregnant, may be pregnant, or are planning to become pregnant.  If you drink alcohol: ? Limit how much you use to 0-1 drink a day. ? Limit intake if you are breastfeeding.  Be aware of how much alcohol is in your drink. In the [place] one drink equals one 12 oz bottle of beer (355 mL), one 5 oz glass of wine (148 mL), or one 1 oz glass of hard liquor (44 mL). General instructions  Schedule regular health, dental, and eye exams.  Stay current with your vaccines.  Tell your health care provider if: ? You often feel depressed. ? You have ever been abused or do not feel safe at home. Summary  Adopting a healthy lifestyle and getting preventive care are important in promoting health and wellness.  Follow your health care provider's instructions about healthy  diet, exercising, and getting tested or screened for diseases.  Follow your health care provider's instructions on monitoring your cholesterol and blood pressure. This information is not intended to replace advice given to you by your health care provider. Make sure you discuss any questions you have with your health care provider. Document Revised: 03/10/2018 Document Reviewed: 03/10/2018 [employer] Patient Education  2020 [employer]  

## 2019-10-17 NOTE — Progress Notes (Signed)
BP 121/82 (BP Location: Left Arm, Patient Position: Sitting, Cuff Size: Normal)   Pulse 68   Temp 98.5 F (36.9 C) (Oral)   Ht 5\' 5"  (1.651 m)   Wt 223 lb 3.2 oz (101.2 kg)   LMP 10/10/2019   SpO2 99%   BMI 37.14 kg/m    Subjective:    Patient ID: 12/11/2019, female    DOB: Aug 23, 1995, 24 y.o.   MRN: [id number]  HPI: Crystal Sullivan is a 24 y.o. female presenting on 10/17/2019 for comprehensive medical examination. Current medical complaints include:none  Menopausal Symptoms: no  Depression Screen done today and results listed below:  Depression screen [facility] 2/9 10/17/2019 10/12/2018 10/08/2017 10/06/2016 11/28/2015  Decreased Interest 0 0 0 0 0  Down, Depressed, Hopeless 0 0 0 0 0  PHQ - 2 Score 0 0 0 0 0  Altered sleeping 0 1 0 - 1  Tired, decreased energy 0 0 0 - 1  Change in appetite 0 1 0 - 0  Feeling bad or failure about yourself  0 0 0 - 1  Trouble concentrating 0 0 0 - 0  Moving slowly or fidgety/restless 0 0 0 - 0  Suicidal thoughts 0 0 0 - 0  PHQ-9 Score 0 2 0 - 3  Difficult doing work/chores Not difficult at all Not difficult at all Not difficult at all - -    Past Medical History:  Past Medical History:  Diagnosis Date  . Adjustment disorder with anxiety   . Elevated blood pressure     Surgical History:  Past Surgical History:  Procedure Laterality Date  . MULTIPLE TOOTH EXTRACTIONS     canine teeth removed  . WISDOM TOOTH EXTRACTION      Medications:  Current Outpatient Medications on File Prior to Visit  Medication Sig  . Biotin w/ Vitamins C & E (HAIR/SKIN/NAILS PO) Take by mouth.  . cyclobenzaprine (FLEXERIL) 10 MG tablet Take 1 tablet (10 mg total) by mouth 3 (three) times daily as needed for muscle spasms. DO NOT DRIVE ON THIS MEDICINE  . ketorolac (TORADOL) 10 MG tablet Take 10 mg by mouth every 6 (six) hours as needed.  . Multiple Vitamin (MULTIVITAMIN) tablet Take 1 tablet by mouth daily.  . naproxen (NAPROSYN) 500 MG tablet Take 1 tablet  (500 mg total) by mouth 2 (two) times daily with a meal.   No current facility-administered medications on file prior to visit.    Allergies:  No Known Allergies  Social History:  Social History   Socioeconomic History  . Marital status: Single    Spouse name: Not on file  . Number of children: Not on file  . Years of education: Not on file  . Highest education level: Not on file  Occupational History  . Not on file  Tobacco Use  . Smoking status: Never Smoker  . Smokeless tobacco: Never Used  Vaping Use  . Vaping Use: Never used  Substance and Sexual Activity  . Alcohol use: Yes  . Drug use: No  . Sexual activity: Not Currently  Other Topics Concern  . Not on file  Social History Narrative  . Not on file   Social Determinants of Health   Financial Resource Strain:   . Difficulty of Paying Living Expenses:   Food Insecurity:   . Worried About 11/30/2015 in the Last Year:   . Programme researcher, broadcasting/film/video in the Last Year:   Transportation Needs:   . Lack  of Transportation (Medical):   Marland Kitchen Lack of Transportation (Non-Medical):   Physical Activity:   . Days of Exercise per Week:   . Minutes of Exercise per Session:   Stress:   . Feeling of Stress :   Social Connections:   . Frequency of Communication with Friends and Family:   . Frequency of Social Gatherings with Friends and Family:   . Attends Religious Services:   . Active Member of Clubs or Organizations:   . Attends Banker Meetings:   Marland Kitchen Marital Status:   Intimate Partner Violence:   . Fear of Current or Ex-Partner:   . Emotionally Abused:   Marland Kitchen Physically Abused:   . Sexually Abused:    Social History   Tobacco Use  Smoking Status Never Smoker  Smokeless Tobacco Never Used   Social History   Substance and Sexual Activity  Alcohol Use Yes    Family History:  Family History  Problem Relation Age of Onset  . Hypertension Mother   . Hypertension Father   . Cancer Father         prostate  . Diabetes Paternal Grandmother   . Stroke Paternal Grandfather   . Hypertension Maternal Grandmother     Past medical history, surgical history, medications, allergies, family history and social history reviewed with patient today and changes made to appropriate areas of the chart.   Review of Systems  Constitutional: Negative.   HENT: Negative.   Eyes: Negative.   Respiratory: Negative.   Cardiovascular: Negative.   Gastrointestinal: Negative.   Genitourinary: Negative.   Musculoskeletal: Negative.   Skin: Negative.   Neurological: Negative.   Endo/Heme/Allergies: Negative.   Psychiatric/Behavioral: Negative.     All other ROS negative except what is listed above and in the HPI.      Objective:    BP 121/82 (BP Location: Left Arm, Patient Position: Sitting, Cuff Size: Normal)   Pulse 68   Temp 98.5 F (36.9 C) (Oral)   Ht 5\' 5"  (1.651 m)   Wt 223 lb 3.2 oz (101.2 kg)   LMP 10/10/2019   SpO2 99%   BMI 37.14 kg/m   Wt Readings from Last 3 Encounters:  10/17/19 223 lb 3.2 oz (101.2 kg)  08/26/19 219 lb 3.2 oz (99.4 kg)  08/12/19 218 lb 3.2 oz (99 kg)    Physical Exam Vitals and nursing note reviewed.  Constitutional:      General: She is not in acute distress.    Appearance: Normal appearance. She is not ill-appearing, toxic-appearing or diaphoretic.  HENT:     Head: Normocephalic and atraumatic.     Right Ear: Tympanic membrane, ear canal and external ear normal. There is no impacted cerumen.     Left Ear: Tympanic membrane, ear canal and external ear normal. There is no impacted cerumen.     Nose: Nose normal. No congestion or rhinorrhea.     Mouth/Throat:     Mouth: Mucous membranes are moist.     Pharynx: Oropharynx is clear. No oropharyngeal exudate or posterior oropharyngeal erythema.  Eyes:     General: No scleral icterus.       Right eye: No discharge.        Left eye: No discharge.     Extraocular Movements: Extraocular movements intact.       Conjunctiva/sclera: Conjunctivae normal.     Pupils: Pupils are equal, round, and reactive to light.  Neck:     Vascular: No carotid bruit.  Cardiovascular:  Rate and Rhythm: Normal rate and regular rhythm.     Pulses: Normal pulses.     Heart sounds: No murmur heard.  No friction rub. No gallop.   Pulmonary:     Effort: Pulmonary effort is normal. No respiratory distress.     Breath sounds: Normal breath sounds. No stridor. No wheezing, rhonchi or rales.  Chest:     Chest wall: No tenderness.  Abdominal:     General: Abdomen is flat. Bowel sounds are normal. There is no distension.     Palpations: Abdomen is soft. There is no mass.     Tenderness: There is no abdominal tenderness. There is no right CVA tenderness, left CVA tenderness, guarding or rebound.     Hernia: No hernia is present.  Genitourinary:    Comments: Breast and pelvic exams deferred with shared decision making Musculoskeletal:        General: No swelling, tenderness, deformity or signs of injury.     Cervical back: Normal range of motion and neck supple. No rigidity. No muscular tenderness.     Right lower leg: No edema.     Left lower leg: No edema.  Lymphadenopathy:     Cervical: No cervical adenopathy.  Skin:    General: Skin is warm and dry.     Capillary Refill: Capillary refill takes less than 2 seconds.     Coloration: Skin is not jaundiced or pale.     Findings: No bruising, erythema, lesion or rash.  Neurological:     General: No focal deficit present.     Mental Status: She is alert and oriented to person, place, and time. Mental status is at baseline.     Cranial Nerves: No cranial nerve deficit.     Sensory: No sensory deficit.     Motor: No weakness.     Coordination: Coordination normal.     Gait: Gait normal.     Deep Tendon Reflexes: Reflexes normal.  Psychiatric:        Mood and Affect: Mood normal.        Behavior: Behavior normal.        Thought Content: Thought content normal.         Judgment: Judgment normal.     Results for orders placed or performed in visit on 10/12/18  GC/Chlamydia Probe Amp   Specimen: Urine   UR  Result Value Ref Range   Chlamydia trachomatis, NAA Negative Negative   Neisseria Gonorrhoeae by PCR Negative Negative  CBC with Differential/Platelet  Result Value Ref Range   WBC 7.2 3.4 - 10.8 x10E3/uL   RBC 5.24 3.77 - 5.28 x10E6/uL   Hemoglobin 13.8 11.1 - 15.9 g/dL   Hematocrit 59.5 63.8 - 46.6 %   MCV 83 79 - 97 fL   MCH 26.3 (L) 26.6 - 33.0 pg   MCHC 31.9 31 - 35 g/dL   RDW 75.6 43.3 - 29.5 %   Platelets 326 150 - 450 x10E3/uL   Neutrophils 59 Not Estab. %   Lymphs 34 Not Estab. %   Monocytes 6 Not Estab. %   Eos 1 Not Estab. %   Basos 0 Not Estab. %   Neutrophils Absolute 4.2 1 - 7 x10E3/uL   Lymphocytes Absolute 2.4 0 - 3 x10E3/uL   Monocytes Absolute 0.4 0 - 0 x10E3/uL   EOS (ABSOLUTE) 0.1 0.0 - 0.4 x10E3/uL   Basophils Absolute 0.0 0 - 0 x10E3/uL   Immature Granulocytes 0 Not Estab. %  Immature Grans (Abs) 0.0 0.0 - 0.1 x10E3/uL  Comprehensive metabolic panel  Result Value Ref Range   Glucose 102 (H) 65 - 99 mg/dL   BUN 13 6 - 20 mg/dL   Creatinine, Ser 1.610.93 0.57 - 1.00 mg/dL   GFR calc non Af Amer 88 >59 mL/min/1.73   GFR calc Af Amer 102 >59 mL/min/1.73   BUN/Creatinine Ratio 14 9 - 23   Sodium 138 134 - 144 mmol/L   Potassium 4.3 3.5 - 5.2 mmol/L   Chloride 100 96 - 106 mmol/L   CO2 22 20 - 29 mmol/L   Calcium 9.7 8.7 - 10.2 mg/dL   Total Protein 6.9 6.0 - 8.5 g/dL   Albumin 4.5 3.9 - 5.0 g/dL   Globulin, Total 2.4 1.5 - 4.5 g/dL   Albumin/Globulin Ratio 1.9 1.2 - 2.2   Bilirubin Total 0.3 0.0 - 1.2 mg/dL   Alkaline Phosphatase 65 39 - 117 IU/L   AST 15 0 - 40 IU/L   ALT 11 0 - 32 IU/L  Lipid Panel w/o Chol/HDL Ratio  Result Value Ref Range   Cholesterol, Total 223 (H) 100 - 199 mg/dL   Triglycerides 91 0 - 149 mg/dL   HDL 51 >09>39 mg/dL   VLDL Cholesterol Cal 18 5 - 40 mg/dL   LDL Calculated 604154  (H) 0 - 99 mg/dL  TSH  Result Value Ref Range   TSH 2.780 0.450 - 4.500 uIU/mL  UA/M w/rflx Culture, Routine   Specimen: Urine   URINE  Result Value Ref Range   Specific Gravity, UA 1.025 1.005 - 1.030   pH, UA 5.5 5.0 - 7.5   Color, UA Yellow Yellow   Appearance Ur Clear Clear   Leukocytes,UA Negative Negative   Protein,UA Negative Negative/Trace   Glucose, UA Negative Negative   Ketones, UA Trace (A) Negative   RBC, UA Negative Negative   Bilirubin, UA Negative Negative   Urobilinogen, Ur 0.2 0.2 - 1.0 mg/dL   Nitrite, UA Negative Negative  HIV Antibody (routine testing w rflx)  Result Value Ref Range   HIV Screen 4th Generation wRfx Non Reactive Non Reactive  RPR  Result Value Ref Range   RPR Ser Ql Non Reactive Non Reactive  Hepatitis, Acute  Result Value Ref Range   Hep A IgM Negative Negative   Hepatitis B Surface Ag Negative Negative   Hep B C IgM Negative Negative   Hep C Virus Ab 0.1 0.0 - 0.9 s/co ratio  HSV(herpes simplex vrs) 1+2 ab-IgG  Result Value Ref Range   HSV 1 Glycoprotein G Ab, IgG <0.91 0.00 - 0.90 index   HSV 2 IgG, Type Spec <0.91 0.00 - 0.90 index  Cytology - PAP  Result Value Ref Range   Adequacy      Satisfactory for evaluation  endocervical/transformation zone component PRESENT.   Diagnosis      NEGATIVE FOR INTRAEPITHELIAL LESIONS OR MALIGNANCY.   Material Submitted CervicoVaginal Pap [ThinPrep Imaged]       Assessment & Plan:   Problem List Items Addressed This Visit    None    Visit Diagnoses    Routine general medical examination at a health care facility    -  Primary   Vaccines up to date/declined. Screening labs checked today. Pap up to date. Continue diet and exercise. Continue to monitor.    Relevant Orders   CBC with Differential/Platelet   Comprehensive metabolic panel   GC/Chlamydia Probe Amp   HIV Antibody (routine  testing w rflx)   Lipid Panel w/o Chol/HDL Ratio   TSH   Urinalysis, Routine w reflex microscopic    RPR   HSV(herpes simplex vrs) 1+2 ab-IgG   Hepatitis, Acute       Follow up plan: Return in about 1 year (around 10/16/2020) for Physical.   LABORATORY TESTING:  - Pap smear: up to date  IMMUNIZATIONS:   - Tdap: Tetanus vaccination status reviewed: last tetanus booster within 10 years. - Influenza: Postponed to flu season - Pneumovax: Not applicable - COVID: Declined  PATIENT COUNSELING:   Advised to take 1 mg of folate supplement per day if capable of pregnancy.   Sexuality: Discussed sexually transmitted diseases, partner selection, use of condoms, avoidance of unintended pregnancy  and contraceptive alternatives.   Advised to avoid cigarette smoking.  I discussed with the patient that most people either abstain from alcohol or drink within safe limits (<=14/week and <=4 drinks/occasion for males, <=7/weeks and <= 3 drinks/occasion for females) and that the risk for alcohol disorders and other health effects rises proportionally with the number of drinks per week and how often a drinker exceeds daily limits.  Discussed cessation/primary prevention of drug use and availability of treatment for abuse.   Diet: Encouraged to adjust caloric intake to maintain  or achieve ideal body weight, to reduce intake of dietary saturated fat and total fat, to limit sodium intake by avoiding high sodium foods and not adding table salt, and to maintain adequate dietary potassium and calcium preferably from fresh fruits, vegetables, and low-fat dairy products.    stressed the importance of regular exercise  Injury prevention: Discussed safety belts, safety helmets, smoke detector, smoking near bedding or upholstery.   Dental health: Discussed importance of regular tooth brushing, flossing, and dental visits.    NEXT PREVENTATIVE PHYSICAL DUE IN 1 YEAR. Return in about 1 year (around 10/16/2020) for Physical.

## 2019-10-18 LAB — COMPREHENSIVE METABOLIC PANEL
ALT: 12 IU/L (ref 0–32)
AST: 15 IU/L (ref 0–40)
Albumin/Globulin Ratio: 1.7 (ref 1.2–2.2)
Albumin: 4.1 g/dL (ref 3.9–5.0)
Alkaline Phosphatase: 62 IU/L (ref 48–121)
BUN/Creatinine Ratio: 16 (ref 9–23)
BUN: 12 mg/dL (ref 6–20)
Bilirubin Total: <0.2 mg/dL (ref 0.0–1.2)
CO2: 21 mmol/L (ref 20–29)
Calcium: 9 mg/dL (ref 8.7–10.2)
Chloride: 104 mmol/L (ref 96–106)
Creatinine, Ser: 0.77 mg/dL (ref 0.57–1.00)
GFR calc Af Amer: 127 mL/min/{1.73_m2} (ref >59)
GFR calc non Af Amer: 110 mL/min/{1.73_m2} (ref >59)
Globulin, Total: 2.4 g/dL (ref 1.5–4.5)
Glucose: 96 mg/dL (ref 65–99)
Potassium: 4.7 mmol/L (ref 3.5–5.2)
Sodium: 138 mmol/L (ref 134–144)
Total Protein: 6.5 g/dL (ref 6.0–8.5)

## 2019-10-18 LAB — CBC WITH DIFFERENTIAL/PLATELET
Basophils Absolute: 0 10*3/uL (ref 0.0–0.2)
Basos: 0 %
EOS (ABSOLUTE): 0.1 10*3/uL (ref 0.0–0.4)
Eos: 2 %
Hematocrit: 39.2 % (ref 34.0–46.6)
Hemoglobin: 12.5 g/dL (ref 11.1–15.9)
Immature Grans (Abs): 0 10*3/uL (ref 0.0–0.1)
Immature Granulocytes: 0 %
Lymphocytes Absolute: 3 10*3/uL (ref 0.7–3.1)
Lymphs: 47 %
MCH: 26 pg — ABNORMAL LOW (ref 26.6–33.0)
MCHC: 31.9 g/dL (ref 31.5–35.7)
MCV: 82 fL (ref 79–97)
Monocytes Absolute: 0.3 10*3/uL (ref 0.1–0.9)
Monocytes: 5 %
Neutrophils Absolute: 3.1 10*3/uL (ref 1.4–7.0)
Neutrophils: 46 %
Platelets: 323 10*3/uL (ref 150–450)
RBC: 4.8 x10E6/uL (ref 3.77–5.28)
RDW: 13.8 % (ref 11.7–15.4)
WBC: 6.6 10*3/uL (ref 3.4–10.8)

## 2019-10-18 LAB — LIPID PANEL W/O CHOL/HDL RATIO
Cholesterol, Total: 194 mg/dL (ref 100–199)
HDL: 45 mg/dL (ref >39)
LDL Chol Calc (NIH): 134 mg/dL — ABNORMAL HIGH (ref 0–99)
Triglycerides: 83 mg/dL (ref 0–149)
VLDL Cholesterol Cal: 15 mg/dL (ref 5–40)

## 2019-10-18 LAB — HIV ANTIBODY (ROUTINE TESTING W REFLEX): HIV Screen 4th Generation wRfx: NONREACTIVE

## 2019-10-18 LAB — HEPATITIS PANEL, ACUTE
Hep A IgM: NEGATIVE
Hep B C IgM: NEGATIVE
Hep C Virus Ab: <0.1 s/co ratio (ref 0.0–0.9)
Hepatitis B Surface Ag: NEGATIVE

## 2019-10-18 LAB — HSV(HERPES SIMPLEX VRS) I + II AB-IGG
HSV 1 Glycoprotein G Ab, IgG: <0.91 index (ref 0.00–0.90)
HSV 2 IgG, Type Spec: <0.91 index (ref 0.00–0.90)

## 2019-10-18 LAB — RPR: RPR Ser Ql: NONREACTIVE

## 2019-10-18 LAB — TSH: TSH: 1.56 u[IU]/mL (ref 0.450–4.500)

## 2020-03-26 DIAGNOSIS — Z03818 Encounter for observation for suspected exposure to other biological agents ruled out: Secondary | ICD-10-CM | POA: Diagnosis not present

## 2020-03-26 DIAGNOSIS — Z1152 Encounter for screening for COVID-19: Secondary | ICD-10-CM | POA: Diagnosis not present

## 2020-03-29 DIAGNOSIS — Z03818 Encounter for observation for suspected exposure to other biological agents ruled out: Secondary | ICD-10-CM | POA: Diagnosis not present

## 2020-03-29 DIAGNOSIS — Z1152 Encounter for screening for COVID-19: Secondary | ICD-10-CM | POA: Diagnosis not present

## 2020-04-06 DIAGNOSIS — Z03818 Encounter for observation for suspected exposure to other biological agents ruled out: Secondary | ICD-10-CM | POA: Diagnosis not present

## 2020-04-06 DIAGNOSIS — Z20822 Contact with and (suspected) exposure to covid-19: Secondary | ICD-10-CM | POA: Diagnosis not present

## 2020-04-23 ENCOUNTER — Other Ambulatory Visit: Payer: Self-pay

## 2020-04-23 ENCOUNTER — Ambulatory Visit (INDEPENDENT_AMBULATORY_CARE_PROVIDER_SITE_OTHER): Payer: BC Managed Care – PPO | Admitting: Family Medicine

## 2020-04-23 ENCOUNTER — Encounter: Payer: Self-pay | Admitting: Family Medicine

## 2020-04-23 VITALS — BP 129/87 | HR 89 | Temp 98.9°F | Wt 227.6 lb

## 2020-04-23 DIAGNOSIS — N898 Other specified noninflammatory disorders of vagina: Secondary | ICD-10-CM

## 2020-04-23 DIAGNOSIS — N760 Acute vaginitis: Secondary | ICD-10-CM | POA: Diagnosis not present

## 2020-04-23 DIAGNOSIS — Z113 Encounter for screening for infections with a predominantly sexual mode of transmission: Secondary | ICD-10-CM

## 2020-04-23 DIAGNOSIS — B9689 Other specified bacterial agents as the cause of diseases classified elsewhere: Secondary | ICD-10-CM

## 2020-04-23 DIAGNOSIS — R8281 Pyuria: Secondary | ICD-10-CM | POA: Diagnosis not present

## 2020-04-23 LAB — URINALYSIS, ROUTINE W REFLEX MICROSCOPIC
Bilirubin, UA: NEGATIVE
Glucose, UA: NEGATIVE
Ketones, UA: NEGATIVE
Leukocytes,UA: NEGATIVE
Nitrite, UA: NEGATIVE
Protein,UA: NEGATIVE
RBC, UA: NEGATIVE
Specific Gravity, UA: 1.025 (ref 1.005–1.030)
Urobilinogen, Ur: 0.2 mg/dL (ref 0.2–1.0)
pH, UA: 5.5 (ref 5.0–7.5)

## 2020-04-23 LAB — WET PREP FOR TRICH, YEAST, CLUE
Clue Cell Exam: POSITIVE — AB
Trichomonas Exam: NEGATIVE
Yeast Exam: NEGATIVE

## 2020-04-23 LAB — MICROSCOPIC EXAMINATION: RBC, Urine: NONE SEEN /hpf (ref 0–2)

## 2020-04-23 MED ORDER — METRONIDAZOLE 500 MG PO TABS: 500.0000 mg | ORAL_TABLET | Freq: Two times a day (BID) | ORAL | 0 refills | Status: DC

## 2020-04-23 NOTE — Addendum Note (Signed)
Addended by: Pablo Ledger on: 04/23/2020 01:27 PM   Modules accepted: Orders

## 2020-04-23 NOTE — Progress Notes (Signed)
BP 129/87   Pulse 89   Temp 98.9 F (37.2 C)   Wt 227 lb 9.6 oz (103.2 kg)   SpO2 97%   BMI 37.87 kg/m    Subjective:    Patient ID: Crystal Sullivan, female    DOB: 12-Apr-1995, 25 y.o.   MRN: [id number]  HPI: Crystal Sullivan is a 25 y.o. female  Chief Complaint  Patient presents with  . Vaginal Itching    Patient states she has vaginal itching and some discharge. Patient states her urine sometimes has a strong odor. Symptoms going on for 7 days.    VAGINAL DISCHARGE Duration: about a week Discharge description: white  Pruritus: yes Dysuria: yes Malodorous: yes Urinary frequency: no Fevers: no Abdominal pain: no  Sexual activity: monogamous History of sexually transmitted diseases: no Recent antibiotic use: no Context: recurrent BV  Treatments attempted: none  Relevant past medical, surgical, family and social history reviewed and updated as indicated. Interim medical history since our last visit reviewed. Allergies and medications reviewed and updated.  Review of Systems  Constitutional: Negative.   Respiratory: Negative.   Cardiovascular: Negative.   Gastrointestinal: Negative.   Genitourinary: Negative.   Musculoskeletal: Negative.   Psychiatric/Behavioral: Negative.     Per HPI unless specifically indicated above     Objective:    BP 129/87   Pulse 89   Temp 98.9 F (37.2 C)   Wt 227 lb 9.6 oz (103.2 kg)   SpO2 97%   BMI 37.87 kg/m   Wt Readings from Last 3 Encounters:  04/23/20 227 lb 9.6 oz (103.2 kg)  10/17/19 223 lb 3.2 oz (101.2 kg)  08/26/19 219 lb 3.2 oz (99.4 kg)    Physical Exam Vitals and nursing note reviewed.  Constitutional:      General: She is not in acute distress.    Appearance: Normal appearance. She is not ill-appearing, toxic-appearing or diaphoretic.  HENT:     Head: Normocephalic and atraumatic.     Right Ear: External ear normal.     Left Ear: External ear normal.     Nose: Nose normal.     Mouth/Throat:      Mouth: Mucous membranes are moist.     Pharynx: Oropharynx is clear.  Eyes:     General: No scleral icterus.       Right eye: No discharge.        Left eye: No discharge.     Extraocular Movements: Extraocular movements intact.     Conjunctiva/sclera: Conjunctivae normal.     Pupils: Pupils are equal, round, and reactive to light.  Cardiovascular:     Rate and Rhythm: Normal rate and regular rhythm.     Pulses: Normal pulses.     Heart sounds: Normal heart sounds. No murmur heard. No friction rub. No gallop.   Pulmonary:     Effort: Pulmonary effort is normal. No respiratory distress.     Breath sounds: Normal breath sounds. No stridor. No wheezing, rhonchi or rales.  Chest:     Chest wall: No tenderness.  Musculoskeletal:        General: Normal range of motion.     Cervical back: Normal range of motion and neck supple.  Skin:    General: Skin is warm and dry.     Capillary Refill: Capillary refill takes less than 2 seconds.     Coloration: Skin is not jaundiced or pale.     Findings: No bruising, erythema, lesion or rash.  Neurological:  General: No focal deficit present.     Mental Status: She is alert and oriented to person, place, and time. Mental status is at baseline.  Psychiatric:        Mood and Affect: Mood normal.        Behavior: Behavior normal.        Thought Content: Thought content normal.        Judgment: Judgment normal.     Results for orders placed or performed in visit on 10/17/19  Microscopic Examination   Urine  Result Value Ref Range   WBC, UA None seen 0 - 5 /hpf   RBC 3-10 (A) 0 - 2 /hpf   Epithelial Cells (non renal) 0-10 0 - 10 /hpf   Bacteria, UA Moderate (A) None seen/Few  CBC with Differential/Platelet  Result Value Ref Range   WBC 6.6 3.4 - 10.8 x10E3/uL   RBC 4.80 3.77 - 5.28 x10E6/uL   Hemoglobin 12.5 11.1 - 15.9 g/dL   Hematocrit 30.1 60.1 - 46.6 %   MCV 82 79 - 97 fL   MCH 26.0 (L) 26.6 - 33.0 pg   MCHC 31.9 31.5 - 35.7 g/dL    RDW 09.3 23.5 - 57.3 %   Platelets 323 150 - 450 x10E3/uL   Neutrophils 46 Not Estab. %   Lymphs 47 Not Estab. %   Monocytes 5 Not Estab. %   Eos 2 Not Estab. %   Basos 0 Not Estab. %   Neutrophils Absolute 3.1 1.4 - 7.0 x10E3/uL   Lymphocytes Absolute 3.0 0.7 - 3.1 x10E3/uL   Monocytes Absolute 0.3 0.1 - 0.9 x10E3/uL   EOS (ABSOLUTE) 0.1 0.0 - 0.4 x10E3/uL   Basophils Absolute 0.0 0.0 - 0.2 x10E3/uL   Immature Granulocytes 0 Not Estab. %   Immature Grans (Abs) 0.0 0.0 - 0.1 x10E3/uL  Comprehensive metabolic panel  Result Value Ref Range   Glucose 96 65 - 99 mg/dL   BUN 12 6 - 20 mg/dL   Creatinine, Ser 2.20 0.57 - 1.00 mg/dL   GFR calc non Af Amer 110 >59 mL/min/1.73   GFR calc Af Amer 127 >59 mL/min/1.73   BUN/Creatinine Ratio 16 9 - 23   Sodium 138 134 - 144 mmol/L   Potassium 4.7 3.5 - 5.2 mmol/L   Chloride 104 96 - 106 mmol/L   CO2 21 20 - 29 mmol/L   Calcium 9.0 8.7 - 10.2 mg/dL   Total Protein 6.5 6.0 - 8.5 g/dL   Albumin 4.1 3.9 - 5.0 g/dL   Globulin, Total 2.4 1.5 - 4.5 g/dL   Albumin/Globulin Ratio 1.7 1.2 - 2.2   Bilirubin Total <0.2 0.0 - 1.2 mg/dL   Alkaline Phosphatase 62 48 - 121 IU/L   AST 15 0 - 40 IU/L   ALT 12 0 - 32 IU/L  HIV Antibody (routine testing w rflx)  Result Value Ref Range   HIV Screen 4th Generation wRfx Non Reactive Non Reactive  Lipid Panel w/o Chol/HDL Ratio  Result Value Ref Range   Cholesterol, Total 194 100 - 199 mg/dL   Triglycerides 83 0 - 149 mg/dL   HDL 45 >25 mg/dL   VLDL Cholesterol Cal 15 5 - 40 mg/dL   LDL Chol Calc (NIH) 427 (H) 0 - 99 mg/dL  TSH  Result Value Ref Range   TSH 1.560 0.450 - 4.500 uIU/mL  Urinalysis, Routine w reflex microscopic  Result Value Ref Range   Specific Gravity, UA 1.025 1.005 - 1.030  pH, UA 5.5 5.0 - 7.5   Color, UA Yellow Yellow   Appearance Ur Clear Clear   Leukocytes,UA Negative Negative   Protein,UA Negative Negative/Trace   Glucose, UA Negative Negative   Ketones, UA Negative  Negative   RBC, UA 3+ (A) Negative   Bilirubin, UA Negative Negative   Urobilinogen, Ur 0.2 0.2 - 1.0 mg/dL   Nitrite, UA Negative Negative   Microscopic Examination See below:   RPR  Result Value Ref Range   RPR Ser Ql Non Reactive Non Reactive  HSV(herpes simplex vrs) 1+2 ab-IgG  Result Value Ref Range   HSV 1 Glycoprotein G Ab, IgG <0.91 0.00 - 0.90 index   HSV 2 IgG, Type Spec <0.91 0.00 - 0.90 index  Hepatitis, Acute  Result Value Ref Range   Hep A IgM Negative Negative   Hepatitis B Surface Ag Negative Negative   Hep B C IgM Negative Negative   Hep C Virus Ab <0.1 0.0 - 0.9 s/co ratio      Assessment & Plan:   Problem List Items Addressed This Visit   None   Visit Diagnoses    BV (bacterial vaginosis)    -  Primary   Will get treated with flagyl. Call with any concerns or not getting better   Relevant Medications   metroNIDAZOLE (FLAGYL) 500 MG tablet   Vaginal itching       + clue cells   Relevant Orders   Urinalysis, Routine w reflex microscopic   WET PREP FOR TRICH, YEAST, CLUE   Routine screening for STI (sexually transmitted infection)       Labs drawn today. Await results.    Relevant Orders   HIV Antibody (routine testing w rflx)   GC/Chlamydia Probe Amp   Hepatitis panel, acute   RPR   HSV(herpes simplex vrs) 1+2 ab-IgG       Follow up plan: Return if symptoms worsen or fail to improve.

## 2020-04-24 LAB — HEPATITIS PANEL, ACUTE
Hep A IgM: NEGATIVE
Hep B C IgM: NEGATIVE
Hep C Virus Ab: 0.1 s/co ratio (ref 0.0–0.9)
Hepatitis B Surface Ag: NEGATIVE

## 2020-04-24 LAB — HSV(HERPES SIMPLEX VRS) I + II AB-IGG
HSV 1 Glycoprotein G Ab, IgG: <0.91 index (ref 0.00–0.90)
HSV 2 IgG, Type Spec: <0.91 index (ref 0.00–0.90)

## 2020-04-24 LAB — HIV ANTIBODY (ROUTINE TESTING W REFLEX): HIV Screen 4th Generation wRfx: NONREACTIVE

## 2020-04-24 LAB — SYPHILIS: RPR W/REFLEX TO RPR TITER AND TREPONEMAL ANTIBODIES, TRADITIONAL SCREENING AND DIAGNOSIS ALGORITHM: RPR Ser Ql: NONREACTIVE

## 2020-04-25 LAB — GC/CHLAMYDIA PROBE AMP
Chlamydia trachomatis, NAA: NEGATIVE
Neisseria Gonorrhoeae by PCR: NEGATIVE

## 2020-04-25 LAB — URINE CULTURE

## 2020-10-18 ENCOUNTER — Encounter: Payer: Self-pay | Admitting: Family Medicine

## 2020-10-18 ENCOUNTER — Ambulatory Visit (INDEPENDENT_AMBULATORY_CARE_PROVIDER_SITE_OTHER): Payer: BC Managed Care – PPO | Admitting: Family Medicine

## 2020-10-18 ENCOUNTER — Other Ambulatory Visit: Payer: Self-pay

## 2020-10-18 VITALS — BP 122/75 | HR 66 | Temp 98.3°F | Ht 65.7 in | Wt 219.0 lb

## 2020-10-18 DIAGNOSIS — Z0000 Encounter for general adult medical examination without abnormal findings: Secondary | ICD-10-CM
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
## 2021-06-06 ENCOUNTER — Emergency Department: Payer: No Typology Code available for payment source

## 2021-06-06 ENCOUNTER — Emergency Department
Admission: EM | Admit: 2021-06-06 | Discharge: 2021-06-06 | Disposition: A | Discharge status: Home / Self Care | Payer: No Typology Code available for payment source | Attending: Emergency Medicine | Admitting: Emergency Medicine

## 2021-06-06 ENCOUNTER — Other Ambulatory Visit: Payer: Self-pay

## 2021-06-06 DIAGNOSIS — Y92410 Unspecified street and highway as the place of occurrence of the external cause: Secondary | ICD-10-CM | POA: Insufficient documentation

## 2021-06-06 DIAGNOSIS — S199XXA Unspecified injury of neck, initial encounter: Secondary | ICD-10-CM | POA: Diagnosis not present

## 2021-06-06 DIAGNOSIS — S4991XA Unspecified injury of right shoulder and upper arm, initial encounter: Secondary | ICD-10-CM | POA: Diagnosis not present

## 2021-06-06 DIAGNOSIS — I10 Essential (primary) hypertension: Secondary | ICD-10-CM | POA: Insufficient documentation

## 2021-06-06 MED ORDER — CYCLOBENZAPRINE HCL 10 MG PO TABS: 10.0000 mg | ORAL_TABLET | Freq: Three times a day (TID) | ORAL | 0 refills | Status: AC | PRN

## 2021-06-06 NOTE — ED Notes (Signed)
Pt NAD, a/ox4. Pt verbalizes understanding of all DC and f/u instructions. All questions answered. Pt walks with steady gait to lobby at DC.  ? ?
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
## 2021-06-06 NOTE — Discharge Instructions (Addendum)
-  Take Tylenol/ibuprofen as needed for pain. ?-Take cyclobenzaprine as needed for muscle spasms. ?-Return to the emergency department anytime if you begin to experience any new or worsening symptoms. ?
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
## 2021-06-12 ENCOUNTER — Telehealth: Payer: Self-pay | Admitting: *Deleted

## 2021-06-12 NOTE — Telephone Encounter (Signed)
Transition Care Management Follow-up Telephone Call ?Date of discharge and from where: [facility] 06-06-2021 ?How have you been since you were released from the hospital? Feeling better still a little sore ?Any questions or concerns? No ? ?Items Reviewed: ?Did the pt receive and understand the discharge instructions provided? Yes  ?Medications obtained and verified? Yes  ?Other? No  ?Any new allergies since your discharge? No  ?Dietary orders reviewed? Yes ?Do you have support at home? Yes  ? ?Home Care and Equipment/Supplies: ?Were home health services ordered? not applicable ?If so, what is the name of the agency?   ?Has the agency set up a time to come to the patient's home? not applicable ?Were any new equipment or medical supplies ordered?  No ?What is the name of the medical supply agency?  ?Were you able to get the supplies/equipment? not applicable ?Do you have any questions related to the use of the equipment or supplies? No ? ?Functional Questionnaire: (I = Independent and D = Dependent) ?ADLs: I ? ?Bathing/Dressing- I ? ?Meal Prep- I ? ?Eating- I ? ?Maintaining continence- I ? ?Transferring/Ambulation- I ? ?Managing Meds- I ? ?Follow up appointments reviewed: ? ?PCP Hospital f/u appt confirmed? No   ?Patient states she will call this week to schedule a follow up ?[facility] f[facility]u [facility]t confirmed?  S  no ?Are transportation arrangements needed? No  ?If their condition worsens, is the pt aware to call PCP or go to the Emergency Dept.? Yes ?Was the patient provided with contact information for the PCP's office or ED? Yes ?Was to pt encouraged to call back with questions or concerns? Yes  ?
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
## 2022-10-13 ENCOUNTER — Encounter: Payer: Self-pay | Admitting: Physician Assistant

## 2022-10-13 ENCOUNTER — Ambulatory Visit (INDEPENDENT_AMBULATORY_CARE_PROVIDER_SITE_OTHER): Payer: BC Managed Care – PPO | Admitting: Physician Assistant

## 2022-10-13 VITALS — BP 114/80 | HR 76 | Temp 98.6°F | Ht 65.9 in | Wt 216.4 lb

## 2022-10-13 DIAGNOSIS — Z3009 Encounter for other general counseling and advice on contraception: Secondary | ICD-10-CM

## 2022-10-13 DIAGNOSIS — Z113 Encounter for screening for infections with a predominantly sexual mode of transmission: Secondary | ICD-10-CM | POA: Diagnosis not present

## 2022-10-13 DIAGNOSIS — B9689 Other specified bacterial agents as the cause of diseases classified elsewhere: Secondary | ICD-10-CM | POA: Diagnosis not present

## 2022-10-13 DIAGNOSIS — N760 Acute vaginitis: Secondary | ICD-10-CM | POA: Diagnosis not present

## 2022-10-13 LAB — WET PREP FOR TRICH, YEAST, CLUE
Clue Cell Exam: POSITIVE — AB
Trichomonas Exam: NEGATIVE
Yeast Exam: NEGATIVE

## 2022-10-13 LAB — PREGNANCY, URINE: Preg Test, Ur: NEGATIVE

## 2022-10-13 MED ORDER — METRONIDAZOLE 500 MG PO TABS: 500.0000 mg | ORAL_TABLET | Freq: Two times a day (BID) | ORAL | 0 refills | Status: DC

## 2022-10-13 MED ORDER — NORETHIN ACE-ETH ESTRAD-FE 1-20 MG-MCG PO TABS: 1.0000 | ORAL_TABLET | Freq: Every day | ORAL | 11 refills | Status: DC

## 2022-10-13 NOTE — Progress Notes (Signed)
Acute Office Visit   Patient: Crystal Sullivan   DOB: 1995-10-23   27 y.o. Female  MRN: [id number] Visit Date: 10/13/2022  Today's healthcare provider: Oswaldo Conroy Lizzie Cokley, PA-C  Introduced myself to the patient as a Secondary school teacher and provided education on APPs in clinical practice.    Chief Complaint  Patient presents with   STD Labs    Pt states she would like to had STD labs done. States she has not had any exposures, just would like to be checked.    Subjective    HPI HPI     STD Labs    Additional comments: Pt states she would like to had STD labs done. States she has not had any exposures, just would like to be checked.       Last edited by Malen Gauze, CMA on 10/13/2022  1:07 PM.       STD testing She denies having current symptoms and is wanting screening  We discussed various STD and testing modalities - she would like to be screened for syphilis, HIV, gonorrhea and chlamydia Will also do wet prep for BV, trich, yeast    She is also interested in discussing birth control  She is hesitant about hormonal birth control and would like to be on a low dose if possible She was previously on Loestrin    Medications: Outpatient Medications Prior to Visit  Medication Sig   Multiple Vitamin (MULTIVITAMIN) tablet Take 1 tablet by mouth daily.   [DISCONTINUED] Biotin w/ Vitamins C & E (HAIR/SKIN/NAILS PO) Take by mouth.   No facility-administered medications prior to visit.    Review of Systems  Constitutional:  Negative for activity change, chills, fatigue and fever.  Genitourinary:  Negative for dysuria, flank pain, vaginal bleeding, vaginal discharge and vaginal pain.  Neurological:  Negative for dizziness and headaches.         Objective    BP 114/80   Pulse 76   Temp 98.6 F (37 C) (Oral)   Ht 5' 5.9" (1.674 m)   Wt 216 lb 6.4 oz (98.2 kg)   LMP 10/05/2022 (Exact Date)   SpO2 98%   BMI 35.03 kg/m      Physical Exam Vitals reviewed.   Constitutional:      General: She is awake.     Appearance: Normal appearance. She is well-developed and well-groomed.  HENT:     Head: Normocephalic and atraumatic.  Pulmonary:     Effort: Pulmonary effort is normal.  Neurological:     General: No focal deficit present.     Mental Status: She is alert and oriented to person, place, and time.     GCS: GCS eye subscore is 4. GCS verbal subscore is 5. GCS motor subscore is 6.     Cranial Nerves: No dysarthria or facial asymmetry.     Motor: Motor function is intact.     Coordination: Coordination is intact.     Gait: Gait is intact.  Psychiatric:        Attention and Perception: Attention and perception normal.        Mood and Affect: Mood and affect normal.        Speech: Speech normal.        Behavior: Behavior normal. Behavior is cooperative.       Results for orders placed or performed in visit on 10/13/22  GC/Chlamydia Probe Amp   Specimen: Urine   UR  Result Value  Ref Range   Chlamydia trachomatis, NAA Negative Negative   Neisseria Gonorrhoeae by PCR Negative Negative  WET PREP FOR TRICH, YEAST, CLUE   Specimen: Urine   Urine  Result Value Ref Range   Trichomonas Exam Negative Negative   Yeast Exam Negative Negative   Clue Cell Exam Positive (A) Negative  Treponemal Antibodies, TPPA  Result Value Ref Range   Treponemal Antibodies, TPPA Non Reactive Non Reactive   Interpretation: Comment   RPR w/reflex to TrepSure  Result Value Ref Range   RPR Non Reactive Non Reactive  HIV antibody (with reflex)  Result Value Ref Range   HIV Screen 4th Generation wRfx Non Reactive Non Reactive  Pregnancy, urine  Result Value Ref Range   Preg Test, Ur Negative Negative    Assessment & Plan      No follow-ups on file.      Problem List Items Addressed This Visit       Other   Counseling for birth control, oral contraceptives - Primary    We discussed multiple forms of birth control today as well as need for OB/Gyn  apt for LARCs if she would prefer those Provided opportunity for patient questions and answers until she voiced satisfaction with current plan She reports she would like to try OCP- Will provide script today Reviewed potential side effects and contraindications to smoking while taking Urine pregnancy test was negative today Follow up in 6 months or sooner if concerns arise        Relevant Medications   norethindrone-ethinyl estradiol-FE (JUNEL FE 1/20) 1-20 MG-MCG tablet   Other Relevant Orders   Pregnancy, urine (Completed)   Other Visit Diagnoses     Screening for STD (sexually transmitted disease)       Relevant Orders   RPR w/reflex to TrepSure (Completed)   HIV antibody (with reflex) (Completed)   GC/Chlamydia Probe Amp (Completed)   WET PREP FOR TRICH, YEAST, CLUE (Completed)   BV (bacterial vaginosis)       Relevant Medications   metroNIDAZOLE (FLAGYL) 500 MG tablet        No follow-ups on file.   I, Angeles Paolucci E Janijah Symons, PA-C, have reviewed all documentation for this visit. The documentation on 10/16/22 for the exam, diagnosis, procedures, and orders are all accurate and complete.   Jacquelin Hawking, MHS, PA-C [facility]

## 2022-10-13 NOTE — Progress Notes (Signed)
Your urine pregnancy test was negative.  I have sent in a script for birth control for you to start per the included instructions. Your cervicovaginal swab was positive for bacterial vaginosis.  I have sent in a script for Flagyl to be taken by mouth twice per day for 7 days. Please finish the entire course unless you are instructed to stop or develop an allergic reaction. Please note that this medication can cause severe nausea and vomiting if alcohol is consumed while you are taking it so please refrain from this during the week you are on it. Please let us know if you have further questions or concerns.  We will keep you updated on the results of your other tests once they area available.

## 2022-10-14 LAB — TREPONEMAL ANTIBODIES, TPPA: Treponemal Antibodies, TPPA: NONREACTIVE

## 2022-10-14 LAB — RPR W/REFLEX TO TREPSURE: RPR: NONREACTIVE

## 2022-10-14 LAB — HIV ANTIBODY (ROUTINE TESTING W REFLEX): HIV Screen 4th Generation wRfx: NONREACTIVE

## 2022-10-15 LAB — GC/CHLAMYDIA PROBE AMP
Chlamydia trachomatis, NAA: NEGATIVE
Neisseria Gonorrhoeae by PCR: NEGATIVE

## 2022-10-16 DIAGNOSIS — Z3009 Encounter for other general counseling and advice on contraception: Secondary | ICD-10-CM | POA: Insufficient documentation

## 2022-10-16 NOTE — Assessment & Plan Note (Signed)
We discussed multiple forms of birth control today as well as need for OB/Gyn apt for LARCs if she would prefer those Provided opportunity for patient questions and answers until she voiced satisfaction with current plan She reports she would like to try OCP- Will provide script today Reviewed potential side effects and contraindications to smoking while taking Urine pregnancy test was negative today Follow up in 6 months or sooner if concerns arise

## 2022-10-17 NOTE — Progress Notes (Signed)
Your labs have returned  Your testing was negative for syphilis, HIV, chlamydia, and gonorrhea Please let us know if you have further questions or concerns.

## 2022-11-17 ENCOUNTER — Ambulatory Visit (INDEPENDENT_AMBULATORY_CARE_PROVIDER_SITE_OTHER): Payer: BC Managed Care – PPO | Admitting: Family Medicine

## 2022-11-17 ENCOUNTER — Other Ambulatory Visit (HOSPITAL_COMMUNITY)
Admission: RE | Admit: 2022-11-17 | Discharge: 2022-11-17 | Disposition: A | Discharge status: Home / Self Care | Payer: Self-pay | Source: Ambulatory Visit | Attending: Family Medicine | Admitting: Family Medicine

## 2022-11-17 ENCOUNTER — Encounter: Payer: Self-pay | Admitting: Family Medicine

## 2022-11-17 VITALS — BP 131/89 | HR 66 | Temp 97.9°F | Ht 65.0 in | Wt 217.6 lb

## 2022-11-17 DIAGNOSIS — N898 Other specified noninflammatory disorders of vagina: Secondary | ICD-10-CM

## 2022-11-17 DIAGNOSIS — Z0000 Encounter for general adult medical examination without abnormal findings: Secondary | ICD-10-CM | POA: Diagnosis not present

## 2022-11-17 DIAGNOSIS — N926 Irregular menstruation, unspecified: Secondary | ICD-10-CM | POA: Diagnosis not present

## 2022-11-17 DIAGNOSIS — R110 Nausea: Secondary | ICD-10-CM

## 2022-11-17 DIAGNOSIS — B9689 Other specified bacterial agents as the cause of diseases classified elsewhere: Secondary | ICD-10-CM

## 2022-11-17 LAB — WET PREP FOR TRICH, YEAST, CLUE
Clue Cell Exam: POSITIVE — AB
Trichomonas Exam: NEGATIVE
Yeast Exam: NEGATIVE

## 2022-11-17 LAB — PREGNANCY, URINE: Preg Test, Ur: NEGATIVE

## 2022-11-17 MED ORDER — METRONIDAZOLE 500 MG PO TABS
500.0000 mg | ORAL_TABLET | Freq: Two times a day (BID) | ORAL | 0 refills | Status: AC
Start: 2022-11-17 — End: ?

## 2022-11-17 NOTE — Progress Notes (Signed)
BP 131/89   Pulse 66   Temp 97.9 F (36.6 C) (Oral)   Ht 5\' 5"  (1.651 m)   Wt 217 lb 9.6 oz (98.7 kg)   LMP 10/17/2022 (Approximate)   SpO2 97%   BMI 36.21 kg/m    Subjective:    Patient ID: Crystal Sullivan, female    DOB: Jun 12, 1995, 27 y.o.   MRN: [id number]  HPI: Crystal Sullivan is a 27 y.o. female presenting on 11/17/2022 for comprehensive medical examination. Current medical complaints include: wants to get pregnancy test today due to nausea earlier this week.  Menopausal Symptoms: no  Depression Screen done today and results listed below:     11/17/2022    1:28 PM 10/13/2022    1:08 PM 10/18/2020    8:19 AM 10/17/2019    8:22 AM 10/12/2018    9:10 AM  Depression screen PHQ 2/9  Decreased Interest 0 0 0 0 0  Down, Depressed, Hopeless 0 0 0 0 0  PHQ - 2 Score 0 0 0 0 0  Altered sleeping 1 1  0 1  Tired, decreased energy 0 1  0 0  Change in appetite 0 0  0 1  Feeling bad or failure about yourself  0 0  0 0  Trouble concentrating 0 0  0 0  Moving slowly or fidgety/restless 0 0  0 0  Suicidal thoughts 0 0  0 0  PHQ-9 Score 1 2  0 2  Difficult doing work/chores Not difficult at all Not difficult at all  Not difficult at all Not difficult at all    Past Medical History:  Past Medical History:  Diagnosis Date   Adjustment disorder with anxiety    Elevated blood pressure     Surgical History:  Past Surgical History:  Procedure Laterality Date   MULTIPLE TOOTH EXTRACTIONS     canine teeth removed   WISDOM TOOTH EXTRACTION      Medications:  Current Outpatient Medications on File Prior to Visit  Medication Sig   Multiple Vitamin (MULTIVITAMIN) tablet Take 1 tablet by mouth daily.   norethindrone-ethinyl estradiol-FE (JUNEL FE 1/20) 1-20 MG-MCG tablet Take 1 tablet by mouth daily.   No current facility-administered medications on file prior to visit.    Allergies:  No Known Allergies  Social History:  Social History   Socioeconomic History   Marital  status: Single    Spouse name: Not on file   Number of children: Not on file   Years of education: Not on file   Highest education level: Not on file  Occupational History   Not on file  Tobacco Use   Smoking status: Never   Smokeless tobacco: Never  Vaping Use   Vaping status: Never Used  Substance and Sexual Activity   Alcohol use: Yes    Comment: on occasion   Drug use: No   Sexual activity: Not Currently  Other Topics Concern   Not on file  Social History Narrative   Not on file   Social Determinants of Health   Financial Resource Strain: Not on file  Food Insecurity: Not on file  Transportation Needs: Not on file  Physical Activity: Not on file  Stress: Not on file  Social Connections: Not on file  Intimate Partner Violence: Not on file   Social History   Tobacco Use  Smoking Status Never  Smokeless Tobacco Never   Social History   Substance and Sexual Activity  Alcohol Use Yes   Comment:  on occasion    Family History:  Family History  Problem Relation Age of Onset   Hypertension Mother    Hypertension Father    Cancer Father        prostate   Hypertension Maternal Grandmother    Diabetes Paternal Grandmother    Stroke Paternal Grandfather     Past medical history, surgical history, medications, allergies, family history and social history reviewed with patient today and changes made to appropriate areas of the chart.   Review of Systems  Constitutional: Negative.   HENT: Negative.    Eyes: Negative.   Respiratory: Negative.    Cardiovascular: Negative.   Gastrointestinal:  Positive for nausea. Negative for abdominal pain, blood in stool, constipation, diarrhea, heartburn, melena and vomiting.  Genitourinary: Negative.   Musculoskeletal: Negative.   Skin: Negative.   Neurological: Negative.   Endo/Heme/Allergies:  Positive for environmental allergies. Negative for polydipsia. Does not bruise/bleed easily.  Psychiatric/Behavioral: Negative.      All other ROS negative except what is listed above and in the HPI.      Objective:    BP 131/89   Pulse 66   Temp 97.9 F (36.6 C) (Oral)   Ht 5\' 5"  (1.651 m)   Wt 217 lb 9.6 oz (98.7 kg)   LMP 10/17/2022 (Approximate)   SpO2 97%   BMI 36.21 kg/m   Wt Readings from Last 3 Encounters:  11/17/22 217 lb 9.6 oz (98.7 kg)  10/13/22 216 lb 6.4 oz (98.2 kg)  06/06/21 220 lb (99.8 kg)    Physical Exam Vitals and nursing note reviewed.  Constitutional:      General: She is not in acute distress.    Appearance: Normal appearance. She is not ill-appearing, toxic-appearing or diaphoretic.  HENT:     Head: Normocephalic and atraumatic.     Right Ear: Tympanic membrane, ear canal and external ear normal. There is no impacted cerumen.     Left Ear: Tympanic membrane, ear canal and external ear normal. There is no impacted cerumen.     Nose: Nose normal. No congestion or rhinorrhea.     Mouth/Throat:     Mouth: Mucous membranes are moist.     Pharynx: Oropharynx is clear. No oropharyngeal exudate or posterior oropharyngeal erythema.  Eyes:     General: No scleral icterus.       Right eye: No discharge.        Left eye: No discharge.     Extraocular Movements: Extraocular movements intact.     Conjunctiva/sclera: Conjunctivae normal.     Pupils: Pupils are equal, round, and reactive to light.  Neck:     Vascular: No carotid bruit.  Cardiovascular:     Rate and Rhythm: Normal rate and regular rhythm.     Pulses: Normal pulses.     Heart sounds: No murmur heard.    No friction rub. No gallop.  Pulmonary:     Effort: Pulmonary effort is normal. No respiratory distress.     Breath sounds: Normal breath sounds. No stridor. No wheezing, rhonchi or rales.  Chest:     Chest wall: No tenderness.  Abdominal:     General: Abdomen is flat. Bowel sounds are normal. There is no distension.     Palpations: Abdomen is soft. There is no mass.     Tenderness: There is no abdominal tenderness.  There is no right CVA tenderness, left CVA tenderness, guarding or rebound.     Hernia: No hernia is present.  Genitourinary:    Comments: Breast and pelvic exams deferred with shared decision making Musculoskeletal:        General: No swelling, tenderness, deformity or signs of injury.     Cervical back: Normal range of motion and neck supple. No rigidity. No muscular tenderness.     Right lower leg: No edema.     Left lower leg: No edema.  Lymphadenopathy:     Cervical: No cervical adenopathy.  Skin:    General: Skin is warm and dry.     Capillary Refill: Capillary refill takes less than 2 seconds.     Coloration: Skin is not jaundiced or pale.     Findings: No bruising, erythema, lesion or rash.  Neurological:     General: No focal deficit present.     Mental Status: She is alert and oriented to person, place, and time. Mental status is at baseline.     Cranial Nerves: No cranial nerve deficit.     Sensory: No sensory deficit.     Motor: No weakness.     Coordination: Coordination normal.     Gait: Gait normal.     Deep Tendon Reflexes: Reflexes normal.  Psychiatric:        Mood and Affect: Mood normal.        Behavior: Behavior normal.        Thought Content: Thought content normal.        Judgment: Judgment normal.     Results for orders placed or performed in visit on 10/13/22  GC/Chlamydia Probe Amp   Specimen: Urine   UR  Result Value Ref Range   Chlamydia trachomatis, NAA Negative Negative   Neisseria Gonorrhoeae by PCR Negative Negative  WET PREP FOR TRICH, YEAST, CLUE   Specimen: Urine   Urine  Result Value Ref Range   Trichomonas Exam Negative Negative   Yeast Exam Negative Negative   Clue Cell Exam Positive (A) Negative  Treponemal Antibodies, TPPA  Result Value Ref Range   Treponemal Antibodies, TPPA Non Reactive Non Reactive   Interpretation: Comment   RPR w/reflex to TrepSure  Result Value Ref Range   RPR Non Reactive Non Reactive  HIV antibody  (with reflex)  Result Value Ref Range   HIV Screen 4th Generation wRfx Non Reactive Non Reactive  Pregnancy, urine  Result Value Ref Range   Preg Test, Ur Negative Negative      Assessment & Plan:   Problem List Items Addressed This Visit   None Visit Diagnoses     Routine general medical examination at a health care facility    -  Primary   Vaccines up to date. Screening labs checked today. Pap done. Continue diet and exercise. Call with any concerns.   Relevant Orders   CBC with Differential/Platelet   Comprehensive metabolic panel   Lipid Panel w/o Chol/HDL Ratio   TSH   GC/Chlamydia Probe Amp   Cytology - PAP   Nausea       Pregnancy test negative today.   Relevant Orders   Pregnancy, urine   Vaginal discharge       Relevant Orders   WET PREP FOR TRICH, YEAST, CLUE   BV (bacterial vaginosis)       Will treat with flagyl. Call with any concerns.   Relevant Medications   metroNIDAZOLE (FLAGYL) 500 MG tablet        Follow up plan: Return in about 1 year (around 11/17/2023) for physical.   LABORATORY TESTING:  -  Pap smear: pap done  IMMUNIZATIONS:   - Tdap: Tetanus vaccination status reviewed: last tetanus booster within 10 years. - Influenza: Refused - Pneumovax: Not applicable - Prevnar: Not applicable - COVID: Refused - HPV: Up to date - Shingrix vaccine: Not applicable  PATIENT COUNSELING:   Advised to take 1 mg of folate supplement per day if capable of pregnancy.   Sexuality: Discussed sexually transmitted diseases, partner selection, use of condoms, avoidance of unintended pregnancy  and contraceptive alternatives.   Advised to avoid cigarette smoking.  I discussed with the patient that most people either abstain from alcohol or drink within safe limits (<=14/week and <=4 drinks/occasion for males, <=7/weeks and <= 3 drinks/occasion for females) and that the risk for alcohol disorders and other health effects rises proportionally with the number of  drinks per week and how often a drinker exceeds daily limits.  Discussed cessation/primary prevention of drug use and availability of treatment for abuse.   Diet: Encouraged to adjust caloric intake to maintain  or achieve ideal body weight, to reduce intake of dietary saturated fat and total fat, to limit sodium intake by avoiding high sodium foods and not adding table salt, and to maintain adequate dietary potassium and calcium preferably from fresh fruits, vegetables, and low-fat dairy products.    stressed the importance of regular exercise  Injury prevention: Discussed safety belts, safety helmets, smoke detector, smoking near bedding or upholstery.   Dental health: Discussed importance of regular tooth brushing, flossing, and dental visits.    NEXT PREVENTATIVE PHYSICAL DUE IN 1 YEAR. Return in about 1 year (around 11/17/2023) for physical.

## 2022-11-18 LAB — COMPREHENSIVE METABOLIC PANEL
ALT: 22 IU/L (ref 0–32)
AST: 22 IU/L (ref 0–40)
Albumin: 4.4 g/dL (ref 4.0–5.0)
Alkaline Phosphatase: 65 IU/L (ref 44–121)
BUN/Creatinine Ratio: 18 (ref 9–23)
BUN: 15 mg/dL (ref 6–20)
Bilirubin Total: 0.3 mg/dL (ref 0.0–1.2)
CO2: 21 mmol/L (ref 20–29)
Calcium: 9.2 mg/dL (ref 8.7–10.2)
Chloride: 103 mmol/L (ref 96–106)
Creatinine, Ser: 0.84 mg/dL (ref 0.57–1.00)
Globulin, Total: 2.3 g/dL (ref 1.5–4.5)
Glucose: 91 mg/dL (ref 70–99)
Potassium: 4.1 mmol/L (ref 3.5–5.2)
Sodium: 138 mmol/L (ref 134–144)
Total Protein: 6.7 g/dL (ref 6.0–8.5)
eGFR: 99 mL/min/{1.73_m2} (ref >59)

## 2022-11-18 LAB — LIPID PANEL W/O CHOL/HDL RATIO
Cholesterol, Total: 225 mg/dL — ABNORMAL HIGH (ref 100–199)
HDL: 41 mg/dL (ref >39)
LDL Chol Calc (NIH): 165 mg/dL — ABNORMAL HIGH (ref 0–99)
Triglycerides: 107 mg/dL (ref 0–149)
VLDL Cholesterol Cal: 19 mg/dL (ref 5–40)

## 2022-11-18 LAB — CBC WITH DIFFERENTIAL/PLATELET
Basophils Absolute: 0 10*3/uL (ref 0.0–0.2)
Basos: 0 %
EOS (ABSOLUTE): 0 10*3/uL (ref 0.0–0.4)
Eos: 1 %
Hematocrit: 40.9 % (ref 34.0–46.6)
Hemoglobin: 13.2 g/dL (ref 11.1–15.9)
Immature Grans (Abs): 0 10*3/uL (ref 0.0–0.1)
Immature Granulocytes: 0 %
Lymphocytes Absolute: 2 10*3/uL (ref 0.7–3.1)
Lymphs: 39 %
MCH: 26.3 pg — ABNORMAL LOW (ref 26.6–33.0)
MCHC: 32.3 g/dL (ref 31.5–35.7)
MCV: 82 fL (ref 79–97)
Monocytes Absolute: 0.4 10*3/uL (ref 0.1–0.9)
Monocytes: 9 %
Neutrophils Absolute: 2.6 10*3/uL (ref 1.4–7.0)
Neutrophils: 51 %
Platelets: 243 10*3/uL (ref 150–450)
RBC: 5.01 x10E6/uL (ref 3.77–5.28)
RDW: 13.7 % (ref 11.7–15.4)
WBC: 5 10*3/uL (ref 3.4–10.8)

## 2022-11-18 LAB — TSH: TSH: 0.92 u[IU]/mL (ref 0.450–4.500)

## 2022-11-19 LAB — CYTOLOGY - PAP
Adequacy: ABSENT
Diagnosis: NEGATIVE

## 2022-11-19 NOTE — Progress Notes (Signed)
Contacted via MyChart   Good evening, pap returned normal.  Repeat in 3 years:)

## 2022-11-20 LAB — GC/CHLAMYDIA PROBE AMP
Chlamydia trachomatis, NAA: NEGATIVE
Neisseria Gonorrhoeae by PCR: NEGATIVE

## 2022-12-30 ENCOUNTER — Ambulatory Visit: Payer: Self-pay

## 2022-12-30 NOTE — Telephone Encounter (Signed)
    Chief Complaint: Pt. Has 1 missed period. Home pregnancy test negative. Has right lower side cramping, mild. Symptoms: Above, also has vaginal irritation Frequency: Last week Pertinent Negatives: Patient denies  Disposition: [] ED /[] Urgent Care (no appt availability in office) / [x] Appointment(In office/virtual)/ []  [facility]/ [] Home Care/ [] Refused Recommended Disposition /[] [facility]/ []  Follow-up with PCP Additional Notes: Pt. Agrees with appointment.  Reason for Disposition  [1] MILD pain (e.g., does not interfere with normal activities) AND [2] pain comes and goes (cramps) AND [3] present > 48 hours  (Exception: This same abdominal pain is a chronic symptom recurrent or ongoing AND present > 4 weeks.)  Answer Assessment - Initial Assessment Questions 1. LOCATION: "Where does it hurt?"      Right lower side  2. RADIATION: "Does the pain shoot anywhere else?" (e.g., chest, back)     No 3. ONSET: "When did the pain begin?" (e.g., minutes, hours or days ago)      1 week ago 4. SUDDEN: "Gradual or sudden onset?"     Gradual 5. PATTERN "Does the pain come and go, or is it constant?"    - If it comes and goes: "How long does it last?" "Do you have pain now?"     (Note: Comes and goes means the pain is intermittent. It goes away completely between bouts.)    - If constant: "Is it getting better, staying the same, or getting worse?"      (Note: Constant means the pain never goes away completely; most serious pain is constant and gets worse.)      Constant 6. SEVERITY: "How bad is the pain?"  (e.g., Scale 1-10; mild, moderate, or severe)    - MILD (1-3): Doesn't interfere with normal activities, abdomen soft and not tender to touch.     - MODERATE (4-7): Interferes with normal activities or awakens from sleep, abdomen tender to touch.     - SEVERE (8-10): Excruciating pain, doubled over, unable to do any normal activities.       2 7. RECURRENT SYMPTOM: "Have  you ever had this type of stomach pain before?" If Yes, ask: "When was the last time?" and "What happened that time?"      No 8. CAUSE: "What do you think is causing the stomach pain?"     Unsure 9. RELIEVING/AGGRAVATING FACTORS: "What makes it better or worse?" (e.g., antacids, bending or twisting motion, bowel movement)     No 10. OTHER SYMPTOMS: "Do you have any other symptoms?" (e.g., back pain, diarrhea, fever, urination pain, vomiting)       Missed period, slight vaginal irritation 11. PREGNANCY: "Is there any chance you are pregnant?" "When was your last menstrual period?"       No  Protocols used: Abdominal Pain - [facility]

## 2022-12-31 ENCOUNTER — Encounter: Payer: Self-pay | Admitting: Family Medicine

## 2022-12-31 ENCOUNTER — Ambulatory Visit (INDEPENDENT_AMBULATORY_CARE_PROVIDER_SITE_OTHER): Payer: BC Managed Care – PPO | Admitting: Family Medicine

## 2022-12-31 VITALS — BP 108/75 | HR 83 | Wt 224.2 lb

## 2022-12-31 DIAGNOSIS — B9689 Other specified bacterial agents as the cause of diseases classified elsewhere: Secondary | ICD-10-CM

## 2022-12-31 DIAGNOSIS — N760 Acute vaginitis: Secondary | ICD-10-CM | POA: Diagnosis not present

## 2022-12-31 DIAGNOSIS — N898 Other specified noninflammatory disorders of vagina: Secondary | ICD-10-CM

## 2022-12-31 DIAGNOSIS — R300 Dysuria: Secondary | ICD-10-CM | POA: Diagnosis not present

## 2022-12-31 DIAGNOSIS — N912 Amenorrhea, unspecified: Secondary | ICD-10-CM | POA: Diagnosis not present

## 2022-12-31 LAB — WET PREP FOR TRICH, YEAST, CLUE
Clue Cell Exam: POSITIVE — AB
Trichomonas Exam: NEGATIVE
Yeast Exam: NEGATIVE

## 2022-12-31 LAB — PREGNANCY, URINE: Preg Test, Ur: NEGATIVE

## 2022-12-31 MED ORDER — METRONIDAZOLE 500 MG PO TABS
500.0000 mg | ORAL_TABLET | Freq: Two times a day (BID) | ORAL | 0 refills | Status: DC
Start: 2022-12-31 — End: 2023-04-07

## 2022-12-31 NOTE — Progress Notes (Signed)
BP 108/75   Pulse 83   Wt 224 lb 3.2 oz (101.7 kg)   SpO2 98%   BMI 37.31 kg/m    Subjective:    Patient ID: Crystal Sullivan, female    DOB: 03-07-1996, 27 y.o.   MRN: [id number]  HPI: Crystal Sullivan is a 27 y.o. female  Chief Complaint  Patient presents with   Amenorrhea    Patient says she has not had her cycle since the beginning of August. 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 Patient says she has a slight vaginal odor and burning with urination. Patient says she first noticed symptoms for at least a few weeks.    ABNORMAL MENSTRUAL PERIODS G0P0000 Duration: Since August Average interval between menses: 28-31 days Flow: heavy initially, then lighter Dysmenorrhea: yes Intermenstrual bleeding:no Postcoital bleeding: no Contraception: OCP- just started on this one in July Sexual activity: Recent unprotected sexual encounter History of sexually transmitted diseases: no History GYN procedures: no Abnormal pap smears: no   Dyspareunia: no Vaginal discharge:yes Abdominal pain: yes Galactorrhea: no Hirsuitism: yes Frequent bruising/mucosal bleeding: no Double vision:no Hot flashes: no  Relevant past medical, surgical, family and social history reviewed and updated as indicated. Interim medical history since our last visit reviewed. Allergies and medications reviewed and updated.  Review of Systems  Constitutional: Negative.   Respiratory: Negative.    Cardiovascular: Negative.   Gastrointestinal: Negative.   Genitourinary:  Positive for menstrual problem. Negative for decreased urine volume, difficulty urinating, dyspareunia, dysuria, enuresis, flank pain, frequency, genital sores, hematuria, pelvic pain, urgency, vaginal bleeding, vaginal discharge and vaginal pain.  Musculoskeletal: Negative.   Psychiatric/Behavioral: Negative.      Per HPI unless specifically indicated above     Objective:    BP 108/75   Pulse 83    Wt 224 lb 3.2 oz (101.7 kg)   SpO2 98%   BMI 37.31 kg/m   Wt Readings from Last 3 Encounters:  12/31/22 224 lb 3.2 oz (101.7 kg)  11/17/22 217 lb 9.6 oz (98.7 kg)  10/13/22 216 lb 6.4 oz (98.2 kg)    Physical Exam Vitals and nursing note reviewed.  Constitutional:      General: She is not in acute distress.    Appearance: Normal appearance. She is not ill-appearing, toxic-appearing or diaphoretic.  HENT:     Head: Normocephalic and atraumatic.     Right Ear: External ear normal.     Left Ear: External ear normal.     Nose: Nose normal.     Mouth/Throat:     Mouth: Mucous membranes are moist.     Pharynx: Oropharynx is clear.  Eyes:     General: No scleral icterus.       Right eye: No discharge.        Left eye: No discharge.     Extraocular Movements: Extraocular movements intact.     Conjunctiva/sclera: Conjunctivae normal.     Pupils: Pupils are equal, round, and reactive to light.  Cardiovascular:     Rate and Rhythm: Normal rate and regular rhythm.     Pulses: Normal pulses.     Heart sounds: Normal heart sounds. No murmur heard.    No friction rub. No gallop.  Pulmonary:     Effort: Pulmonary effort is normal. No respiratory distress.     Breath sounds: Normal breath sounds. No stridor. No wheezing, rhonchi or rales.  Chest:     Chest wall: No tenderness.  Musculoskeletal:        General: Normal range of motion.     Cervical back: Normal range of motion and neck supple.  Skin:    General: Skin is warm and dry.     Capillary Refill: Capillary refill takes less than 2 seconds.     Coloration: Skin is not jaundiced or pale.     Findings: No bruising, erythema, lesion or rash.  Neurological:     General: No focal deficit present.     Mental Status: She is alert and oriented to person, place, and time. Mental status is at baseline.  Psychiatric:        Mood and Affect: Mood normal.        Behavior: Behavior normal.        Thought Content: Thought content normal.         Judgment: Judgment normal.     Results for orders placed or performed in visit on 11/17/22  GC/Chlamydia Probe Amp   Specimen: Urine   UR  Result Value Ref Range   Chlamydia trachomatis, NAA Negative Negative   Neisseria Gonorrhoeae by PCR Negative Negative  WET PREP FOR TRICH, YEAST, CLUE   Specimen: Vaginal Swab   Vaginal Swab  Result Value Ref Range   Trichomonas Exam Negative Negative   Yeast Exam Negative Negative   Clue Cell Exam Positive (A) Negative  CBC with Differential/Platelet  Result Value Ref Range   WBC 5.0 3.4 - 10.8 x10E3/uL   RBC 5.01 3.77 - 5.28 x10E6/uL   Hemoglobin 13.2 11.1 - 15.9 g/dL   Hematocrit 01.0 93.2 - 46.6 %   MCV 82 79 - 97 fL   MCH 26.3 (L) 26.6 - 33.0 pg   MCHC 32.3 31.5 - 35.7 g/dL   RDW 35.5 73.2 - 20.2 %   Platelets 243 150 - 450 x10E3/uL   Neutrophils 51 Not Estab. %   Lymphs 39 Not Estab. %   Monocytes 9 Not Estab. %   Eos 1 Not Estab. %   Basos 0 Not Estab. %   Neutrophils Absolute 2.6 1.4 - 7.0 x10E3/uL   Lymphocytes Absolute 2.0 0.7 - 3.1 x10E3/uL   Monocytes Absolute 0.4 0.1 - 0.9 x10E3/uL   EOS (ABSOLUTE) 0.0 0.0 - 0.4 x10E3/uL   Basophils Absolute 0.0 0.0 - 0.2 x10E3/uL   Immature Granulocytes 0 Not Estab. %   Immature Grans (Abs) 0.0 0.0 - 0.1 x10E3/uL  Comprehensive metabolic panel  Result Value Ref Range   Glucose 91 70 - 99 mg/dL   BUN 15 6 - 20 mg/dL   Creatinine, Ser 5.42 0.57 - 1.00 mg/dL   eGFR 99 >70 WC/BJS/2.83   BUN/Creatinine Ratio 18 9 - 23   Sodium 138 134 - 144 mmol/L   Potassium 4.1 3.5 - 5.2 mmol/L   Chloride 103 96 - 106 mmol/L   CO2 21 20 - 29 mmol/L   Calcium 9.2 8.7 - 10.2 mg/dL   Total Protein 6.7 6.0 - 8.5 g/dL   Albumin 4.4 4.0 - 5.0 g/dL   Globulin, Total 2.3 1.5 - 4.5 g/dL   Bilirubin Total 0.3 0.0 - 1.2 mg/dL   Alkaline Phosphatase 65 44 - 121 IU/L   AST 22 0 - 40 IU/L   ALT 22 0 - 32 IU/L  Lipid Panel w/o Chol/HDL Ratio  Result Value Ref Range   Cholesterol, Total 225 (H) 100  - 199 mg/dL   Triglycerides 151 0 - 149 mg/dL   HDL 41 >76  mg/dL   VLDL Cholesterol Cal 19 5 - 40 mg/dL   LDL Chol Calc (NIH) 782 (H) 0 - 99 mg/dL  TSH  Result Value Ref Range   TSH 0.920 0.450 - 4.500 uIU/mL  Pregnancy, urine  Result Value Ref Range   Preg Test, Ur Negative Negative  Cytology - PAP  Result Value Ref Range   Adequacy      Satisfactory for evaluation; transformation zone component ABSENT.   Diagnosis      - Negative for intraepithelial lesion or malignancy (NILM)      Assessment & Plan:   Problem List Items Addressed This Visit   None Visit Diagnoses     Amenorrhea    -  Primary   Pregnancy negative. Likely reaction to new OCP. Will check labs and [place]. Consider adjusting hormone levels. Follow up 1-2 months.   Relevant Orders   Pregnancy, urine   Estradiol   LH   FSH   Prolactin   DHEA-sulfate   Testosterone, free, total([facility])   US Transvaginal Non-OB   Vaginal odor       + clue cells on exam   Relevant Orders   WET PREP FOR TRICH, YEAST, CLUE   BV (bacterial vaginosis)       Will treat with flagyl. Call with any concerns.   Relevant Medications   metroNIDAZOLE (FLAGYL) 500 MG tablet        Follow up plan: No follow-ups on file.

## 2023-01-01 ENCOUNTER — Telehealth: Payer: Self-pay | Admitting: Family Medicine

## 2023-01-01 NOTE — Telephone Encounter (Signed)
Copied from CRM [id number]. Topic: General - Inquiry >> Jan 01, 2023  3:22 PM Runell Gess P wrote: Reason for CRM: pt seen her hormone lab from yesterday in her mychart and would like someone to call her back.  CB#  [phone]

## 2023-01-03 LAB — LUTEINIZING HORMONE: LH: 12.2 m[IU]/mL

## 2023-01-03 LAB — PROLACTIN: Prolactin: 18.8 ng/mL (ref 4.8–33.4)

## 2023-01-03 LAB — FOLLICLE STIMULATING HORMONE: FSH: 5.3 m[IU]/mL

## 2023-01-03 LAB — TESTOSTERONE, FREE, TOTAL, SHBG
Sex Hormone Binding: 28.2 nmol/L (ref 24.6–122.0)
Testosterone, Free: 4.3 pg/mL — ABNORMAL HIGH (ref 0.0–4.2)
Testosterone: 79 ng/dL — ABNORMAL HIGH (ref 13–71)

## 2023-01-03 LAB — ESTRADIOL: Estradiol: 28 pg/mL

## 2023-01-03 LAB — DHEA-SULFATE: DHEA-SO4: 190 ug/dL (ref 84.8–378.0)

## 2023-01-06 ENCOUNTER — Ambulatory Visit
Admission: RE | Admit: 2023-01-06 | Discharge: 2023-01-06 | Disposition: A | Discharge status: Home / Self Care | Payer: BC Managed Care – PPO | Source: Ambulatory Visit | Attending: Family Medicine | Admitting: Family Medicine

## 2023-01-06 ENCOUNTER — Other Ambulatory Visit: Payer: Self-pay | Admitting: Family Medicine

## 2023-01-06 DIAGNOSIS — R9389 Abnormal findings on diagnostic imaging of other specified body structures: Secondary | ICD-10-CM | POA: Diagnosis not present

## 2023-01-06 DIAGNOSIS — N912 Amenorrhea, unspecified: Secondary | ICD-10-CM

## 2023-01-06 DIAGNOSIS — B9689 Other specified bacterial agents as the cause of diseases classified elsewhere: Secondary | ICD-10-CM

## 2023-01-06 DIAGNOSIS — N898 Other specified noninflammatory disorders of vagina: Secondary | ICD-10-CM

## 2023-01-16 ENCOUNTER — Encounter: Payer: Self-pay | Admitting: Family Medicine

## 2023-01-16 DIAGNOSIS — E282 Polycystic ovarian syndrome: Secondary | ICD-10-CM | POA: Insufficient documentation

## 2023-01-21 NOTE — Progress Notes (Signed)
Appointment scheduled.

## 2023-01-30 ENCOUNTER — Ambulatory Visit: Payer: BC Managed Care – PPO | Admitting: Family Medicine

## 2023-02-23 ENCOUNTER — Ambulatory Visit: Payer: BC Managed Care – PPO | Admitting: Family Medicine

## 2023-03-04 ENCOUNTER — Telehealth: Payer: Self-pay | Admitting: Family Medicine

## 2023-03-04 NOTE — Telephone Encounter (Signed)
Called patient  she stated that she will send provider a mychart message

## 2023-04-07 ENCOUNTER — Other Ambulatory Visit (HOSPITAL_COMMUNITY)
Admission: RE | Admit: 2023-04-07 | Discharge: 2023-04-07 | Disposition: A | Discharge status: Home / Self Care | Payer: BC Managed Care – PPO | Source: Ambulatory Visit | Attending: Physician Assistant | Admitting: Physician Assistant

## 2023-04-07 ENCOUNTER — Ambulatory Visit: Payer: BC Managed Care – PPO | Admitting: Internal Medicine

## 2023-04-07 ENCOUNTER — Encounter: Payer: Self-pay | Admitting: Physician Assistant

## 2023-04-07 ENCOUNTER — Ambulatory Visit (INDEPENDENT_AMBULATORY_CARE_PROVIDER_SITE_OTHER): Payer: BC Managed Care – PPO | Admitting: Physician Assistant

## 2023-04-07 VITALS — BP 128/70 | HR 73 | Resp 16 | Ht 65.0 in | Wt 227.0 lb

## 2023-04-07 DIAGNOSIS — Z202 Contact with and (suspected) exposure to infections with a predominantly sexual mode of transmission: Secondary | ICD-10-CM
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
## 2023-04-08 LAB — HIV ANTIBODY (ROUTINE TESTING W REFLEX): HIV 1&2 Ab, 4th Generation: NONREACTIVE

## 2023-04-08 LAB — RPR: RPR Ser Ql: NONREACTIVE

## 2023-04-09 LAB — CERVICOVAGINAL ANCILLARY ONLY
Bacterial Vaginitis (gardnerella): NEGATIVE
Candida Glabrata: NEGATIVE
Candida Vaginitis: NEGATIVE
Chlamydia: NEGATIVE
Comment: NEGATIVE
Comment: NEGATIVE
Comment: NEGATIVE
Comment: NEGATIVE
Comment: NEGATIVE
Comment: NORMAL
Neisseria Gonorrhea: NEGATIVE
Trichomonas: NEGATIVE

## 2023-04-09 NOTE — Progress Notes (Signed)
 Your cervicovaginal swab was negative for gonorrhea, chlamydia, BV, trichomonas, and yeast infection.

## 2023-04-09 NOTE — Progress Notes (Signed)
 Your HIV and syphilis testing were negative. We are still waiting on your swab results and will keep you updated once they are back

## 2023-04-30 ENCOUNTER — Encounter: Payer: Self-pay | Admitting: Family Medicine

## 2023-04-30 ENCOUNTER — Ambulatory Visit (INDEPENDENT_AMBULATORY_CARE_PROVIDER_SITE_OTHER): Payer: BC Managed Care – PPO | Admitting: Family Medicine

## 2023-04-30 VITALS — BP 117/83 | HR 71 | Wt 220.4 lb

## 2023-04-30 DIAGNOSIS — Z3009 Encounter for other general counseling and advice on contraception: Secondary | ICD-10-CM | POA: Diagnosis not present

## 2023-04-30 DIAGNOSIS — Z113 Encounter for screening for infections with a predominantly sexual mode of transmission: Secondary | ICD-10-CM

## 2023-04-30 DIAGNOSIS — E282 Polycystic ovarian syndrome: Secondary | ICD-10-CM

## 2023-04-30 LAB — BAYER DCA HB A1C WAIVED: HB A1C (BAYER DCA - WAIVED): 5.7 % — ABNORMAL HIGH (ref 4.8–5.6)

## 2023-04-30 MED ORDER — NORETHIN ACE-ETH ESTRAD-FE 1-20 MG-MCG PO TABS: 1.0000 | ORAL_TABLET | Freq: Every day | ORAL | 11 refills | Status: AC

## 2023-04-30 NOTE — Progress Notes (Signed)
BP 117/83   Pulse 71   Wt 220 lb 6.4 oz (100 kg)   LMP 04/20/2023 (Exact Date)   SpO2 97%   BMI 36.68 kg/m    Subjective:    Patient ID: Crystal Sullivan, female    DOB: April 03, 1995, 28 y.o.   MRN: [id number]  HPI: Crystal Sullivan is a 28 y.o. female  Chief Complaint  Patient presents with   STI Screening   STD SCREENING Sexual activity:  Recent unprotected sexual encounter Contraception: yes Recent unprotected intercourse: yes History of sexually transmitted diseases: yes Previous sexually transmitted disease screening: yes Genital lesions: no Vaginal discharge: no Dysuria: no Swollen lymph nodes: no Fevers: no Rash: no  Relevant past medical, surgical, family and social history reviewed and updated as indicated. Interim medical history since our last visit reviewed. Allergies and medications reviewed and updated.  Review of Systems  Constitutional: Negative.   Respiratory: Negative.    Cardiovascular: Negative.   Musculoskeletal: Negative.   Psychiatric/Behavioral: Negative.      Per HPI unless specifically indicated above     Objective:    BP 117/83   Pulse 71   Wt 220 lb 6.4 oz (100 kg)   LMP 04/20/2023 (Exact Date)   SpO2 97%   BMI 36.68 kg/m   Wt Readings from Last 3 Encounters:  04/30/23 220 lb 6.4 oz (100 kg)  04/07/23 227 lb (103 kg)  12/31/22 224 lb 3.2 oz (101.7 kg)    Physical Exam Vitals and nursing note reviewed.  Constitutional:      General: She is not in acute distress.    Appearance: Normal appearance. She is not ill-appearing, toxic-appearing or diaphoretic.  HENT:     Head: Normocephalic and atraumatic.     Right Ear: External ear normal.     Left Ear: External ear normal.     Nose: Nose normal.     Mouth/Throat:     Mouth: Mucous membranes are moist.     Pharynx: Oropharynx is clear.  Eyes:     General: No scleral icterus.       Right eye: No discharge.        Left eye: No discharge.     Extraocular Movements: Extraocular  movements intact.     Conjunctiva/sclera: Conjunctivae normal.     Pupils: Pupils are equal, round, and reactive to light.  Cardiovascular:     Rate and Rhythm: Normal rate and regular rhythm.     Pulses: Normal pulses.     Heart sounds: Normal heart sounds. No murmur heard.    No friction rub. No gallop.  Pulmonary:     Effort: Pulmonary effort is normal. No respiratory distress.     Breath sounds: Normal breath sounds. No stridor. No wheezing, rhonchi or rales.  Chest:     Chest wall: No tenderness.  Musculoskeletal:        General: Normal range of motion.     Cervical back: Normal range of motion and neck supple.  Skin:    General: Skin is warm and dry.     Capillary Refill: Capillary refill takes less than 2 seconds.     Coloration: Skin is not jaundiced or pale.     Findings: No bruising, erythema, lesion or rash.  Neurological:     General: No focal deficit present.     Mental Status: She is alert and oriented to person, place, and time. Mental status is at baseline.  Psychiatric:  Mood and Affect: Mood normal.        Behavior: Behavior normal.        Thought Content: Thought content normal.        Judgment: Judgment normal.     Results for orders placed or performed in visit on 04/07/23  Cervicovaginal ancillary only   Collection Time: 04/07/23  2:02 PM  Result Value Ref Range   Neisseria Gonorrhea Negative    Chlamydia Negative    Trichomonas Negative    Bacterial Vaginitis (gardnerella) Negative    Candida Vaginitis Negative    Candida Glabrata Negative    Comment Normal Reference Range Candida Species - Negative    Comment Normal Reference Range Candida Galbrata - Negative    Comment Normal Reference Range Trichomonas - Negative    Comment Normal Reference Ranger Chlamydia - Negative    Comment      Normal Reference Range Neisseria Gonorrhea - Negative   Comment      Normal Reference Range Bacterial Vaginosis - Negative  HIV antibody (with reflex)    Collection Time: 04/07/23  2:17 PM  Result Value Ref Range   HIV 1&2 Ab, 4th Generation NON-REACTIVE NON-REACTIVE  RPR   Collection Time: 04/07/23  2:17 PM  Result Value Ref Range   RPR Ser Ql NON-REACTIVE NON-REACTIVE      Assessment & Plan:   Problem List Items Addressed This Visit       Endocrine   PCOS (polycystic ovarian syndrome) - Primary   Has been drinking a tea currently. Not interested in medicine at this time. Continue to monitor.        Other   Counseling for birth control, oral contraceptives   Refills given today. Call with any concerns.       Relevant Medications   norethindrone-ethinyl estradiol-FE (JUNEL FE 1/20) 1-20 MG-MCG tablet   Other Relevant Orders   Bayer DCA Hb A1c Waived   Other Visit Diagnoses       Screening examination for STI       Labs drawn today. Await results. Treat as needed.   Relevant Orders   HIV Antibody (routine testing w rflx)   HSV 1 and 2 Ab, IgG   GC/Chlamydia Probe Amp   RPR w/reflex to TrepSure   Acute Viral Hepatitis (HAV, HBV, HCV)        Follow up plan: Return for As scheduled, physical.

## 2023-04-30 NOTE — Assessment & Plan Note (Signed)
Refills given today. Call with any concerns.

## 2023-04-30 NOTE — Assessment & Plan Note (Signed)
Has been drinking a tea currently. Not interested in medicine at this time. Continue to monitor.

## 2023-05-02 LAB — RPR W/REFLEX TO TREPSURE

## 2023-05-05 LAB — GC/CHLAMYDIA PROBE AMP
Chlamydia trachomatis, NAA: NEGATIVE
Neisseria Gonorrhoeae by PCR: NEGATIVE

## 2023-05-06 ENCOUNTER — Encounter: Payer: Self-pay | Admitting: Family Medicine

## 2023-05-06 LAB — HSV 1 AND 2 AB, IGG

## 2023-05-06 LAB — ACUTE VIRAL HEPATITIS (HAV, HBV, HCV)

## 2023-05-06 LAB — HCV INTERPRETATION

## 2023-05-06 LAB — TREPONEMAL ANTIBODIES, TPPA: Treponemal Antibodies, TPPA: NONREACTIVE

## 2023-05-06 LAB — RPR W/REFLEX TO TREPSURE

## 2023-05-06 LAB — HIV ANTIBODY (ROUTINE TESTING W REFLEX)

## 2023-08-05 ENCOUNTER — Encounter: Payer: Self-pay | Admitting: Nurse Practitioner

## 2023-08-05 ENCOUNTER — Ambulatory Visit: Admitting: Nurse Practitioner

## 2023-08-05 VITALS — BP 118/84 | HR 70 | Ht 65.0 in | Wt 216.2 lb

## 2023-08-05 DIAGNOSIS — B379 Candidiasis, unspecified: Secondary | ICD-10-CM

## 2023-08-05 DIAGNOSIS — N898 Other specified noninflammatory disorders of vagina: Secondary | ICD-10-CM | POA: Diagnosis not present

## 2023-08-05 LAB — WET PREP FOR TRICH, YEAST, CLUE
Clue Cell Exam: NEGATIVE
Trichomonas Exam: NEGATIVE
Yeast Exam: POSITIVE — AB

## 2023-08-05 LAB — URINALYSIS, ROUTINE W REFLEX MICROSCOPIC
Bilirubin, UA: NEGATIVE
Glucose, UA: NEGATIVE
Ketones, UA: NEGATIVE
Leukocytes,UA: NEGATIVE
Nitrite, UA: NEGATIVE
Protein,UA: NEGATIVE
RBC, UA: NEGATIVE
Specific Gravity, UA: >1.03 — ABNORMAL HIGH (ref 1.005–1.030)
Urobilinogen, Ur: 0.2 mg/dL (ref 0.2–1.0)
pH, UA: 5.5 (ref 5.0–7.5)

## 2023-08-05 MED ORDER — FLUCONAZOLE 150 MG PO TABS: 150.0000 mg | ORAL_TABLET | Freq: Once | ORAL | 0 refills | Status: AC

## 2023-08-05 NOTE — Progress Notes (Signed)
 BP 118/84 (BP Location: Left Arm, Patient Position: Sitting, Cuff Size: Large)   Pulse 70   Ht 5\' 5"  (1.651 m)   Wt 216 lb 3.2 oz (98.1 kg)   LMP 07/21/2023 (Exact Date)   SpO2 98%   BMI 35.98 kg/m    Subjective:    Patient ID: Crystal Sullivan, female    DOB: 1995/06/21, 28 y.o.   MRN: [id number]  HPI: Crystal Sullivan is a 28 y.o. female  Chief Complaint  Patient presents with   Vaginitis    Noticed Saturday. Itchy white discharged with odor.    VAGINAL DISCHARGE Patient states she has had BV in the past.   Duration:  started on saturday Discharge description: white - clear Pruritus: yes Dysuria: no Malodorous: yes Urinary frequency: yes Fevers: no Abdominal pain: yes  Sexual activity: monogamous History of sexually transmitted diseases: no Recent antibiotic use: no Context: recurrent BV  Treatments attempted: none  Relevant past medical, surgical, family and social history reviewed and updated as indicated. Interim medical history since our last visit reviewed. Allergies and medications reviewed and updated.  Review of Systems  Genitourinary:  Positive for frequency and vaginal discharge. Negative for dysuria.       Vaginal itching    Per HPI unless specifically indicated above     Objective:    BP 118/84 (BP Location: Left Arm, Patient Position: Sitting, Cuff Size: Large)   Pulse 70   Ht 5\' 5"  (1.651 m)   Wt 216 lb 3.2 oz (98.1 kg)   LMP 07/21/2023 (Exact Date)   SpO2 98%   BMI 35.98 kg/m   Wt Readings from Last 3 Encounters:  08/05/23 216 lb 3.2 oz (98.1 kg)  04/30/23 220 lb 6.4 oz (100 kg)  04/07/23 227 lb (103 kg)    Physical Exam Vitals and nursing note reviewed.  Constitutional:      General: She is not in acute distress.    Appearance: Normal appearance. She is normal weight. She is not ill-appearing, toxic-appearing or diaphoretic.  HENT:     Head: Normocephalic.     Right Ear: External ear normal.     Left Ear: External ear normal.      Nose: Nose normal.     Mouth/Throat:     Mouth: Mucous membranes are moist.     Pharynx: Oropharynx is clear.  Eyes:     General:        Right eye: No discharge.        Left eye: No discharge.     Extraocular Movements: Extraocular movements intact.     Conjunctiva/sclera: Conjunctivae normal.     Pupils: Pupils are equal, round, and reactive to light.  Cardiovascular:     Rate and Rhythm: Normal rate and regular rhythm.     Heart sounds: No murmur heard. Pulmonary:     Effort: Pulmonary effort is normal. No respiratory distress.     Breath sounds: Normal breath sounds. No wheezing or rales.  Abdominal:     General: Abdomen is flat. Bowel sounds are normal. There is no distension.     Palpations: Abdomen is soft. There is no mass.     Tenderness: There is abdominal tenderness. There is no right CVA tenderness, left CVA tenderness, guarding or rebound.     Hernia: No hernia is present.  Musculoskeletal:     Cervical back: Normal range of motion and neck supple.  Skin:    General: Skin is warm and dry.  Capillary Refill: Capillary refill takes less than 2 seconds.  Neurological:     General: No focal deficit present.     Mental Status: She is alert and oriented to person, place, and time. Mental status is at baseline.  Psychiatric:        Mood and Affect: Mood normal.        Behavior: Behavior normal.        Thought Content: Thought content normal.        Judgment: Judgment normal.     Results for orders placed or performed in visit on 02-May-2023  GC/Chlamydia Probe Amp   Collection Time: 05/02/23  8:22 AM   Specimen: Urine   UR  Result Value Ref Range   Chlamydia trachomatis, NAA Negative Negative   Neisseria Gonorrhoeae by PCR Negative Negative  Bayer DCA Hb A1c Waived   Collection Time: 05/02/23  8:23 AM  Result Value Ref Range   HB A1C (BAYER DCA - WAIVED) 5.7 (H) 4.8 - 5.6 %  Treponemal Antibodies, TPPA   Collection Time: 05-02-2023  8:26 AM  Result Value Ref  Range   Treponemal Antibodies, TPPA Non Reactive Non Reactive   Interpretation: Comment   HIV Antibody (routine testing w rflx)   Collection Time: 05/02/23  8:26 AM  Result Value Ref Range   HIV Screen 4th Generation wRfx Non Reactive Non Reactive  HSV 1 and 2 Ab, IgG   Collection Time: 2023-05-02  8:26 AM  Result Value Ref Range   HSV 1 Glycoprotein G Ab, IgG Non Reactive Non Reactive   HSV 2 IgG, Type Spec Non Reactive Non Reactive  RPR w/reflex to TrepSure   Collection Time: 05-02-23  8:26 AM  Result Value Ref Range   RPR Non Reactive Non Reactive  Acute Viral Hepatitis (HAV, HBV, HCV)   Collection Time: May 02, 2023  8:26 AM  Result Value Ref Range   Hep A IgM Negative Negative   Hepatitis B Surface Ag Negative Negative   Hep B C IgM Negative Negative   HCV Ab Non Reactive Non Reactive  Interpretation:   Collection Time: 05-02-2023  8:26 AM  Result Value Ref Range   HCV Interp 1: Comment       Assessment & Plan:   Problem List Items Addressed This Visit   None Visit Diagnoses       Yeast infection    -  Primary   Yeast seen on Wet Prep. Will treat with Diflucan .  Complete course of medication.  Follow up if not improved.   Relevant Medications   fluconazole  (DIFLUCAN ) 150 MG tablet     Vaginal discharge       Relevant Orders   Urine Culture   WET PREP FOR TRICH, YEAST, CLUE   Urinalysis, Routine w reflex microscopic        Follow up plan: No follow-ups on file.

## 2023-08-07 ENCOUNTER — Encounter: Payer: Self-pay | Admitting: Nurse Practitioner

## 2023-08-07 LAB — URINE CULTURE

## 2023-08-29 NOTE — Patient Instructions (Signed)

## 2023-09-01 ENCOUNTER — Ambulatory Visit: Admitting: Nurse Practitioner

## 2023-09-01 ENCOUNTER — Encounter: Payer: Self-pay | Admitting: Nurse Practitioner

## 2023-09-01 VITALS — BP 120/80 | HR 81 | Temp 97.6°F | Ht 65.0 in | Wt 212.0 lb

## 2023-09-01 DIAGNOSIS — E669 Obesity, unspecified: Secondary | ICD-10-CM

## 2023-09-01 DIAGNOSIS — Z113 Encounter for screening for infections with a predominantly sexual mode of transmission: Secondary | ICD-10-CM | POA: Diagnosis not present

## 2023-09-01 NOTE — Assessment & Plan Note (Addendum)
BMI 35.28.  Recommended eating smaller high protein, low fat meals more frequently and exercising 30 mins a day 5 times a week with a goal of 10-15lb weight loss in the next 3 months. Patient voiced their understanding and motivation to adhere to these recommendations.

## 2023-09-01 NOTE — Progress Notes (Signed)
 BP 120/80   Pulse 81   Temp 97.6 F (36.4 C) (Oral)   Ht 5\' 5"  (1.651 m)   Wt 212 lb (96.2 kg)   LMP 08/11/2023 (Exact Date)   SpO2 97%   BMI 35.28 kg/m    Subjective:    Patient ID: Crystal Sullivan, female    DOB: Oct 25, 1995, 28 y.o.   MRN: [id number]  HPI: Crystal Sullivan is a 28 y.o. female  Chief Complaint  Patient presents with   STD Check    Patient requesting to have STD screening done, no known exposures that patient is aware of   STD SCREENING Presents for STD screening. Sexual activity:  In a Monogamous Relationship Contraception: birth control Recent unprotected intercourse: yes History of sexually transmitted diseases: no Previous sexually transmitted disease screening: yes Lifetime sexual partners: 6 Genital lesions: no Dysuria: no Swollen lymph nodes: no Fevers: no Rash: no   Relevant past medical, surgical, family and social history reviewed and updated as indicated. Interim medical history since our last visit reviewed. Allergies and medications reviewed and updated.  Review of Systems  Constitutional:  Negative for activity change, appetite change, diaphoresis, fatigue and fever.  Respiratory:  Negative for cough, chest tightness and shortness of breath.   Cardiovascular:  Negative for chest pain, palpitations and leg swelling.  Gastrointestinal: Negative.   Genitourinary: Negative.   Neurological: Negative.   Psychiatric/Behavioral: Negative.      Per HPI unless specifically indicated above     Objective:     BP 120/80   Pulse 81   Temp 97.6 F (36.4 C) (Oral)   Ht 5\' 5"  (1.651 m)   Wt 212 lb (96.2 kg)   LMP 08/11/2023 (Exact Date)   SpO2 97%   BMI 35.28 kg/m   Wt Readings from Last 3 Encounters:  09/01/23 212 lb (96.2 kg)  08/05/23 216 lb 3.2 oz (98.1 kg)  04/30/23 220 lb 6.4 oz (100 kg)    Physical Exam Vitals and nursing note reviewed.  Constitutional:      General: She is awake. She is not in acute distress.     Appearance: She is well-developed and well-groomed. She is obese. She is not ill-appearing or toxic-appearing.  HENT:     Head: Normocephalic.     Right Ear: Hearing and external ear normal.     Left Ear: Hearing and external ear normal.  Eyes:     General: Lids are normal.        Right eye: No discharge.        Left eye: No discharge.     Conjunctiva/sclera: Conjunctivae normal.     Pupils: Pupils are equal, round, and reactive to light.  Neck:     Thyroid: No thyromegaly.     Vascular: No carotid bruit.  Cardiovascular:     Rate and Rhythm: Normal rate and regular rhythm.     Heart sounds: Normal heart sounds. No murmur heard.    No gallop.  Pulmonary:     Effort: Pulmonary effort is normal. No accessory muscle usage or respiratory distress.     Breath sounds: Normal breath sounds.  Abdominal:     General: Bowel sounds are normal. There is no distension.     Palpations: Abdomen is soft.     Tenderness: There is no abdominal tenderness.  Musculoskeletal:     Cervical back: Normal range of motion and neck supple.     Right lower leg: No edema.     Left lower  leg: No edema.  Lymphadenopathy:     Cervical: No cervical adenopathy.  Skin:    General: Skin is warm and dry.  Neurological:     Mental Status: She is alert and oriented to person, place, and time.     Deep Tendon Reflexes: Reflexes are normal and symmetric.     Reflex Scores:      Brachioradialis reflexes are 2+ on the right side and 2+ on the left side.      Patellar reflexes are 2+ on the right side and 2+ on the left side. Psychiatric:        Attention and Perception: Attention normal.        Mood and Affect: Mood normal.        Speech: Speech normal.        Behavior: Behavior normal. Behavior is cooperative.        Thought Content: Thought content normal.     Results for orders placed or performed in visit on 08/05/23  WET PREP FOR TRICH, YEAST, CLUE   Collection Time: 08/05/23 11:39 AM   Specimen:  Sterile Swab   Sterile Swab  Result Value Ref Range   Trichomonas Exam Negative Negative   Yeast Exam Positive (A) Negative   Clue Cell Exam Negative Negative  Urine Culture   Collection Time: 08/05/23 11:40 AM   Specimen: Urine   UR  Result Value Ref Range   Urine Culture, Routine Final report    Organism ID, Bacteria Comment   Urinalysis, Routine w reflex microscopic   Collection Time: 08/05/23 11:51 AM  Result Value Ref Range   Specific Gravity, UA >1.030 (H) 1.005 - 1.030   pH, UA 5.5 5.0 - 7.5   Color, UA Yellow Yellow   Appearance Ur Clear Clear   Leukocytes,UA Negative Negative   Protein,UA Negative Negative/Trace   Glucose, UA Negative Negative   Ketones, UA Negative Negative   RBC, UA Negative Negative   Bilirubin, UA Negative Negative   Urobilinogen, Ur 0.2 0.2 - 1.0 mg/dL   Nitrite, UA Negative Negative   Microscopic Examination Comment       Assessment & Plan:   Problem List Items Addressed This Visit       Other   Obesity (BMI 30-39.9) - Primary   BMI 35.28. Recommended eating smaller high protein, low fat meals more frequently and exercising 30 mins a day 5 times a week with a goal of 10-15lb weight loss in the next 3 months. Patient voiced their understanding and motivation to adhere to these recommendations.       Other Visit Diagnoses       Screen for STD (sexually transmitted disease)       Check HSV, RPR, HIV, and gonorrhea/chlamydia today on labs per request.   Relevant Orders   GC/Chlamydia Probe Amp   HIV Antibody (routine testing w rflx)   HSV 1 and 2 Ab, IgG   RPR        Follow up plan: Return if symptoms worsen or fail to improve.

## 2023-09-02 ENCOUNTER — Ambulatory Visit: Payer: Self-pay | Admitting: Nurse Practitioner

## 2023-09-02 LAB — HSV 1 AND 2 AB, IGG
HSV 1 Glycoprotein G Ab, IgG: NONREACTIVE
HSV 2 IgG, Type Spec: NONREACTIVE

## 2023-09-02 LAB — RPR: RPR Ser Ql: NONREACTIVE

## 2023-09-02 LAB — HIV ANTIBODY (ROUTINE TESTING W REFLEX): HIV Screen 4th Generation wRfx: NONREACTIVE

## 2023-09-02 LAB — GC/CHLAMYDIA PROBE AMP
Chlamydia trachomatis, NAA: NEGATIVE
Neisseria Gonorrhoeae by PCR: NEGATIVE

## 2023-09-02 NOTE — Progress Notes (Signed)
 Contacted via MyChart   Good morning Michal, your labs are returning.  Just waiting on urine, but so far all is negative!!  Good news!!  Any questions? Keep being awesome!!  Thank you for allowing me to participate in your care.  I appreciate you. Kindest regards, Jamien Casanova

## 2023-11-19 ENCOUNTER — Ambulatory Visit (INDEPENDENT_AMBULATORY_CARE_PROVIDER_SITE_OTHER): Payer: Self-pay | Admitting: Family Medicine

## 2023-11-19 ENCOUNTER — Encounter: Payer: Self-pay | Admitting: Family Medicine

## 2023-11-19 VITALS — BP 115/80 | HR 87 | Temp 97.9°F | Ht 65.0 in | Wt 213.4 lb

## 2023-11-19 DIAGNOSIS — Z0000 Encounter for general adult medical examination without abnormal findings: Secondary | ICD-10-CM

## 2023-11-19 DIAGNOSIS — R350 Frequency of micturition: Secondary | ICD-10-CM | POA: Diagnosis not present

## 2023-11-19 LAB — PREGNANCY, URINE: Preg Test, Ur: NEGATIVE

## 2023-11-19 LAB — BAYER DCA HB A1C WAIVED: HB A1C (BAYER DCA - WAIVED): 5.8 % — ABNORMAL HIGH (ref 4.8–5.6)

## 2023-11-19 NOTE — Progress Notes (Signed)
 BP 115/80   Pulse 87   Temp 97.9 F (36.6 C) (Oral)   Ht 5' 5 (1.651 m)   Wt 213 lb 6.4 oz (96.8 kg)   SpO2 97%   BMI 35.51 kg/m    Subjective:    Patient ID: Crystal Sullivan, female    DOB: 07/03/1995, 28 y.o.   MRN: [id number]  HPI: Crystal Sullivan is a 28 y.o. female presenting on 11/19/2023 for comprehensive medical examination. Current medical complaints include:  Had some nausea and spotting and urinary frequency- would like pregnancy test.  Menopausal Symptoms: no  Depression Screen done today and results listed below:     11/19/2023    1:15 PM 04/30/2023    8:08 AM 12/31/2022   10:21 AM 11/17/2022    1:28 PM 10/13/2022    1:08 PM  Depression screen PHQ 2/9  Decreased Interest 0 0 0 0 0  Down, Depressed, Hopeless 0 0 0 0 0  PHQ - 2 Score 0 0 0 0 0  Altered sleeping 0 0 1 1 1   Tired, decreased energy 0 0 1 0 1  Change in appetite 0 0 0 0 0  Feeling bad or failure about yourself  0 0 0 0 0  Trouble concentrating 0 0 0 0 0  Moving slowly or fidgety/restless 0 0 0 0 0  Suicidal thoughts 0 0 0 0 0  PHQ-9 Score 0 0 2 1 2   Difficult doing work/chores Not difficult at all Not difficult at all Not difficult at all Not difficult at all Not difficult at all    Past Medical History:  Past Medical History:  Diagnosis Date   Adjustment disorder with anxiety    Elevated blood pressure     Surgical History:  Past Surgical History:  Procedure Laterality Date   MULTIPLE TOOTH EXTRACTIONS     canine teeth removed   WISDOM TOOTH EXTRACTION      Medications:  Current Outpatient Medications on File Prior to Visit  Medication Sig   Multiple Vitamin (MULTIVITAMIN) tablet Take 1 tablet by mouth daily.   norethindrone -ethinyl estradiol -FE (JUNEL FE 1/20) 1-20 MG-MCG tablet Take 1 tablet by mouth daily.   No current facility-administered medications on file prior to visit.    Allergies:  No Known Allergies  Social History:  Social History   Socioeconomic History    Marital status: Single    Spouse name: Not on file   Number of children: Not on file   Years of education: Not on file   Highest education level: Not on file  Occupational History   Not on file  Tobacco Use   Smoking status: Never   Smokeless tobacco: Never  Vaping Use   Vaping status: Never Used  Substance and Sexual Activity   Alcohol use: Yes    Comment: on occasion   Drug use: No   Sexual activity: Not Currently  Other Topics Concern   Not on file  Social History Narrative   Not on file   Social Drivers of Health   Financial Resource Strain: Medium Risk (11/19/2023)   Overall Financial Resource Strain (CARDIA)    Difficulty of Paying Living Expenses: Somewhat hard  Food Insecurity: No Food Insecurity (11/19/2023)   Hunger Vital Sign    Worried About Running Out of Food in the Last Year: Never true    Ran Out of Food in the Last Year: Never true  Transportation Needs: No Transportation Needs (11/19/2023)   PRAPARE - Transportation  Lack of Transportation (Medical): No    Lack of Transportation (Non-Medical): No  Physical Activity: Insufficiently Active (11/19/2023)   Exercise Vital Sign    Days of Exercise per Week: 2 days    Minutes of Exercise per Session: 60 min  Stress: No Stress Concern Present (11/19/2023)   [employer] of Occupational Health - Occupational Stress Questionnaire    Feeling of Stress: Not at all  Social Connections: Moderately Integrated (11/19/2023)   Social Connection and Isolation Panel    Frequency of Communication with Friends and Family: More than three times a week    Frequency of Social Gatherings with Friends and Family: Once a week    Attends Religious Services: More than 4 times per year    Active Member of [employer] or Organizations: Yes    Attends Engineer, structural: More than 4 times per year    Marital Status: Never married  Intimate Partner Violence: Not At Risk (11/19/2023)   Humiliation, Afraid, Rape, and Kick  questionnaire    Fear of Current or Ex-Partner: No    Emotionally Abused: No    Physically Abused: No    Sexually Abused: No   Social History   Tobacco Use  Smoking Status Never  Smokeless Tobacco Never   Social History   Substance and Sexual Activity  Alcohol Use Yes   Comment: on occasion    Family History:  Family History  Problem Relation Age of Onset   Hypertension Mother    Hypertension Father    Cancer Father        prostate   Hypertension Maternal Grandmother    Diabetes Paternal Grandmother    Stroke Paternal Grandfather     Past medical history, surgical history, medications, allergies, family history and social history reviewed with patient today and changes made to appropriate areas of the chart.   Review of Systems  Constitutional: Negative.   HENT: Negative.    Eyes: Negative.   Respiratory: Negative.    Cardiovascular: Negative.   Gastrointestinal:  Positive for constipation, heartburn, nausea and vomiting. Negative for abdominal pain, blood in stool, diarrhea and melena.  Genitourinary:  Positive for frequency. Negative for dysuria, flank pain, hematuria and urgency.  Musculoskeletal: Negative.   Skin: Negative.   Neurological: Negative.   Endo/Heme/Allergies: Negative.   Psychiatric/Behavioral: Negative.     All other ROS negative except what is listed above and in the HPI.      Objective:    BP 115/80   Pulse 87   Temp 97.9 F (36.6 C) (Oral)   Ht 5' 5 (1.651 m)   Wt 213 lb 6.4 oz (96.8 kg)   SpO2 97%   BMI 35.51 kg/m   Wt Readings from Last 3 Encounters:  11/19/23 213 lb 6.4 oz (96.8 kg)  09/01/23 212 lb (96.2 kg)  08/05/23 216 lb 3.2 oz (98.1 kg)    Physical Exam Vitals and nursing note reviewed.  Constitutional:      General: She is not in acute distress.    Appearance: Normal appearance. She is not ill-appearing, toxic-appearing or diaphoretic.  HENT:     Head: Normocephalic and atraumatic.     Right Ear: Tympanic  membrane, ear canal and external ear normal. There is no impacted cerumen.     Left Ear: Tympanic membrane, ear canal and external ear normal. There is no impacted cerumen.     Nose: Nose normal. No congestion or rhinorrhea.     Mouth/Throat:     Mouth:  Mucous membranes are moist.     Pharynx: Oropharynx is clear. No oropharyngeal exudate or posterior oropharyngeal erythema.  Eyes:     General: No scleral icterus.       Right eye: No discharge.        Left eye: No discharge.     Extraocular Movements: Extraocular movements intact.     Conjunctiva/sclera: Conjunctivae normal.     Pupils: Pupils are equal, round, and reactive to light.  Neck:     Vascular: No carotid bruit.  Cardiovascular:     Rate and Rhythm: Normal rate and regular rhythm.     Pulses: Normal pulses.     Heart sounds: No murmur heard.    No friction rub. No gallop.  Pulmonary:     Effort: Pulmonary effort is normal. No respiratory distress.     Breath sounds: Normal breath sounds. No stridor. No wheezing, rhonchi or rales.  Chest:     Chest wall: No tenderness.  Abdominal:     General: Abdomen is flat. Bowel sounds are normal. There is no distension.     Palpations: Abdomen is soft. There is no mass.     Tenderness: There is no abdominal tenderness. There is no right CVA tenderness, left CVA tenderness, guarding or rebound.     Hernia: No hernia is present.  Genitourinary:    Comments: Breast and pelvic exams deferred with shared decision making Musculoskeletal:        General: No swelling, tenderness, deformity or signs of injury.     Cervical back: Normal range of motion and neck supple. No rigidity. No muscular tenderness.     Right lower leg: No edema.     Left lower leg: No edema.  Lymphadenopathy:     Cervical: No cervical adenopathy.  Skin:    General: Skin is warm and dry.     Capillary Refill: Capillary refill takes less than 2 seconds.     Coloration: Skin is not jaundiced or pale.     Findings:  No bruising, erythema, lesion or rash.  Neurological:     General: No focal deficit present.     Mental Status: She is alert and oriented to person, place, and time. Mental status is at baseline.     Cranial Nerves: No cranial nerve deficit.     Sensory: No sensory deficit.     Motor: No weakness.     Coordination: Coordination normal.     Gait: Gait normal.     Deep Tendon Reflexes: Reflexes normal.  Psychiatric:        Mood and Affect: Mood normal.        Behavior: Behavior normal.        Thought Content: Thought content normal.        Judgment: Judgment normal.     Results for orders placed or performed in visit on 09/01/23  GC/Chlamydia Probe Amp   Collection Time: 09/01/23  3:30 PM   Specimen: Urine   UR  Result Value Ref Range   Chlamydia trachomatis, NAA Negative Negative   Neisseria Gonorrhoeae by PCR Negative Negative  HIV Antibody (routine testing w rflx)   Collection Time: 09/01/23  3:30 PM  Result Value Ref Range   HIV Screen 4th Generation wRfx Non Reactive Non Reactive  HSV 1 and 2 Ab, IgG   Collection Time: 09/01/23  3:30 PM  Result Value Ref Range   HSV 1 Glycoprotein G Ab, IgG Non Reactive Non Reactive   HSV 2 IgG,  Type Spec Non Reactive Non Reactive  RPR   Collection Time: 09/01/23  3:30 PM  Result Value Ref Range   RPR Ser Ql Non Reactive Non Reactive      Assessment & Plan:   Problem List Items Addressed This Visit   None Visit Diagnoses       Routine general medical examination at a health care facility    -  Primary   Vaccines up to date/declined. Screening labs checked today. Pap up to date. Continue diet and exercise. Call with any concerns.   Relevant Orders   Bayer DCA Hb A1c Waived   CBC with Differential/Platelet   Comprehensive metabolic panel with GFR   Lipid Panel w/o Chol/HDL Ratio   TSH   HIV Antibody (routine testing w rflx)   HSV 1 and 2 Ab, IgG   GC/Chlamydia Probe Amp   RPR w/reflex to TrepSure   Acute Viral Hepatitis  (HAV, HBV, HCV)   Hepatitis B surface antibody,quantitative     Urinary frequency       Will check pregnancy test. Await results.   Relevant Orders   Pregnancy, urine   Beta hCG quant (ref lab)        Follow up plan: Return in about 1 year (around 11/18/2024) for physical.   LABORATORY TESTING:  - Pap smear: not applicable  IMMUNIZATIONS:   - Tdap: Tetanus vaccination status reviewed: last tetanus booster within 10 years. - Influenza: Refused - Pneumovax: Up to date - Prevnar: Up to date - COVID: Refused - HPV: Up to date   PATIENT COUNSELING:   Advised to take 1 mg of folate supplement per day if capable of pregnancy.   Sexuality: Discussed sexually transmitted diseases, partner selection, use of condoms, avoidance of unintended pregnancy  and contraceptive alternatives.   Advised to avoid cigarette smoking.  I discussed with the patient that most people either abstain from alcohol or drink within safe limits (<=14/week and <=4 drinks/occasion for males, <=7/weeks and <= 3 drinks/occasion for females) and that the risk for alcohol disorders and other health effects rises proportionally with the number of drinks per week and how often a drinker exceeds daily limits.  Discussed cessation/primary prevention of drug use and availability of treatment for abuse.   Diet: Encouraged to adjust caloric intake to maintain  or achieve ideal body weight, to reduce intake of dietary saturated fat and total fat, to limit sodium intake by avoiding high sodium foods and not adding table salt, and to maintain adequate dietary potassium and calcium preferably from fresh fruits, vegetables, and low-fat dairy products.    stressed the importance of regular exercise  Injury prevention: Discussed safety belts, safety helmets, smoke detector, smoking near bedding or upholstery.   Dental health: Discussed importance of regular tooth brushing, flossing, and dental visits.    NEXT PREVENTATIVE  PHYSICAL DUE IN 1 YEAR. Return in about 1 year (around 11/18/2024) for physical.

## 2023-11-19 NOTE — Addendum Note (Signed)
 Addended by: Munachimso Rigdon T on: 11/19/2023 05:02 PM   Modules accepted: Orders

## 2023-11-20 ENCOUNTER — Ambulatory Visit: Payer: Self-pay | Admitting: Family Medicine

## 2023-11-20 LAB — CBC WITH DIFFERENTIAL/PLATELET
Basophils Absolute: 0 x10E3/uL (ref 0.0–0.2)
Basos: 0 %
EOS (ABSOLUTE): 0 x10E3/uL (ref 0.0–0.4)
Eos: 1 %
Hematocrit: 43.2 % (ref 34.0–46.6)
Hemoglobin: 13.9 g/dL (ref 11.1–15.9)
Immature Grans (Abs): 0 x10E3/uL (ref 0.0–0.1)
Immature Granulocytes: 0 %
Lymphocytes Absolute: 2.5 x10E3/uL (ref 0.7–3.1)
Lymphs: 30 %
MCH: 27 pg (ref 26.6–33.0)
MCHC: 32.2 g/dL (ref 31.5–35.7)
MCV: 84 fL (ref 79–97)
Monocytes Absolute: 0.3 x10E3/uL (ref 0.1–0.9)
Monocytes: 4 %
Neutrophils Absolute: 5.3 x10E3/uL (ref 1.4–7.0)
Neutrophils: 65 %
Platelets: 337 x10E3/uL (ref 150–450)
RBC: 5.15 x10E6/uL (ref 3.77–5.28)
RDW: 14.2 % (ref 11.7–15.4)
WBC: 8.2 x10E3/uL (ref 3.4–10.8)

## 2023-11-20 LAB — COMPREHENSIVE METABOLIC PANEL WITH GFR
ALT: 17 IU/L (ref 0–32)
AST: 17 IU/L (ref 0–40)
Albumin: 4.3 g/dL (ref 4.0–5.0)
Alkaline Phosphatase: 55 IU/L (ref 44–121)
BUN/Creatinine Ratio: 12 (ref 9–23)
BUN: 10 mg/dL (ref 6–20)
Bilirubin Total: <0.2 mg/dL (ref 0.0–1.2)
CO2: 19 mmol/L — ABNORMAL LOW (ref 20–29)
Calcium: 9.5 mg/dL (ref 8.7–10.2)
Chloride: 100 mmol/L (ref 96–106)
Creatinine, Ser: 0.85 mg/dL (ref 0.57–1.00)
Globulin, Total: 3 g/dL (ref 1.5–4.5)
Glucose: 76 mg/dL (ref 70–99)
Potassium: 4.3 mmol/L (ref 3.5–5.2)
Sodium: 138 mmol/L (ref 134–144)
Total Protein: 7.3 g/dL (ref 6.0–8.5)
eGFR: 97 mL/min/1.73 (ref >59)

## 2023-11-20 LAB — LIPID PANEL W/O CHOL/HDL RATIO
Cholesterol, Total: 223 mg/dL — ABNORMAL HIGH (ref 100–199)
HDL: 54 mg/dL (ref >39)
LDL Chol Calc (NIH): 154 mg/dL — ABNORMAL HIGH (ref 0–99)
Triglycerides: 86 mg/dL (ref 0–149)
VLDL Cholesterol Cal: 15 mg/dL (ref 5–40)

## 2023-11-20 LAB — BETA HCG QUANT (REF LAB): hCG Quant: <1 m[IU]/mL

## 2023-11-20 LAB — HSV 1 AND 2 AB, IGG
HSV 1 Glycoprotein G Ab, IgG: NONREACTIVE
HSV 2 IgG, Type Spec: NONREACTIVE

## 2023-11-20 LAB — ACUTE VIRAL HEPATITIS (HAV, HBV, HCV)
HCV Ab: NONREACTIVE
Hep A IgM: NEGATIVE
Hep B C IgM: NEGATIVE
Hepatitis B Surface Ag: NEGATIVE

## 2023-11-20 LAB — HEPATITIS B SURFACE ANTIBODY, QUANTITATIVE: Hepatitis B Surf Ab Quant: <3.5 m[IU]/mL — ABNORMAL LOW

## 2023-11-20 LAB — HIV ANTIBODY (ROUTINE TESTING W REFLEX): HIV Screen 4th Generation wRfx: NONREACTIVE

## 2023-11-20 LAB — HCV INTERPRETATION

## 2023-11-20 LAB — TSH: TSH: 0.802 u[IU]/mL (ref 0.450–4.500)

## 2023-11-21 LAB — RPR W/REFLEX TO TREPSURE

## 2023-11-22 LAB — GC/CHLAMYDIA PROBE AMP
Chlamydia trachomatis, NAA: NEGATIVE
Neisseria Gonorrhoeae by PCR: NEGATIVE

## 2023-11-23 LAB — TREPONEMAL ANTIBODIES, TPPA: Treponemal Antibodies, TPPA: NONREACTIVE

## 2023-11-23 LAB — RPR W/REFLEX TO TREPSURE: RPR: NONREACTIVE

## 2024-11-22 ENCOUNTER — Encounter: Admitting: Family Medicine
